# Patient Record
Sex: Female | Born: 1980
Health system: Southern US, Community
[De-identification: ages and names within clinical notes are randomized; demographics above are authoritative.]

## PROBLEM LIST (undated history)

## (undated) DIAGNOSIS — E119 Type 2 diabetes mellitus without complications: Secondary | ICD-10-CM

## (undated) DIAGNOSIS — J45909 Unspecified asthma, uncomplicated: Secondary | ICD-10-CM

## (undated) DIAGNOSIS — N926 Irregular menstruation, unspecified: Secondary | ICD-10-CM

## (undated) DIAGNOSIS — F329 Major depressive disorder, single episode, unspecified: Secondary | ICD-10-CM

## (undated) DIAGNOSIS — N390 Urinary tract infection, site not specified: Secondary | ICD-10-CM

## (undated) DIAGNOSIS — N946 Dysmenorrhea, unspecified: Secondary | ICD-10-CM

## (undated) DIAGNOSIS — M4800 Spinal stenosis, site unspecified: Secondary | ICD-10-CM

## (undated) DIAGNOSIS — R29818 Other symptoms and signs involving the nervous system: Secondary | ICD-10-CM

## (undated) DIAGNOSIS — F419 Anxiety disorder, unspecified: Secondary | ICD-10-CM

## (undated) DIAGNOSIS — M50321 Other cervical disc degeneration at C4-C5 level: Secondary | ICD-10-CM

## (undated) DIAGNOSIS — M719 Bursopathy, unspecified: Secondary | ICD-10-CM

## (undated) DIAGNOSIS — F32A Depression, unspecified: Secondary | ICD-10-CM

## (undated) DIAGNOSIS — N898 Other specified noninflammatory disorders of vagina: Secondary | ICD-10-CM

## (undated) HISTORY — DX: Unspecified asthma, uncomplicated: J45.909

## (undated) HISTORY — DX: Type 2 diabetes mellitus without complications: E11.9

## (undated) HISTORY — DX: Anxiety disorder, unspecified: F41.9

## (undated) HISTORY — PX: ROTATOR CUFF REPAIR: SHX139

## (undated) HISTORY — DX: Other specified noninflammatory disorders of vagina: N89.8

## (undated) HISTORY — DX: Spinal stenosis, site unspecified: M48.00

## (undated) HISTORY — DX: Bursopathy, unspecified: M71.9

## (undated) HISTORY — DX: Urinary tract infection, site not specified: N39.0

## (undated) HISTORY — DX: Irregular menstruation, unspecified: N92.6

## (undated) HISTORY — DX: Dysmenorrhea, unspecified: N94.6

## (undated) HISTORY — DX: Other cervical disc degeneration at C4-C5 level: M50.321

## (undated) HISTORY — DX: Major depressive disorder, single episode, unspecified: F32.9

## (undated) HISTORY — DX: Other symptoms and signs involving the nervous system: R29.818

## (undated) HISTORY — DX: Depression, unspecified: F32.A

---

## 2001-01-04 HISTORY — PX: TUBAL LIGATION: SHX77

## 2005-01-09 ENCOUNTER — Other Ambulatory Visit: Admission: RE | Admit: 2005-01-09 | Discharge: 2005-01-09 | Payer: Self-pay | Admitting: Family Medicine

## 2010-05-21 ENCOUNTER — Emergency Department (HOSPITAL_COMMUNITY): Admission: EM | Admit: 2010-05-21 | Discharge: 2010-05-21 | Payer: Self-pay | Admitting: Emergency Medicine

## 2010-05-23 ENCOUNTER — Emergency Department (HOSPITAL_COMMUNITY): Admission: EM | Admit: 2010-05-23 | Discharge: 2010-05-23 | Payer: Self-pay | Admitting: Emergency Medicine

## 2011-04-09 ENCOUNTER — Other Ambulatory Visit (HOSPITAL_COMMUNITY)
Admission: RE | Admit: 2011-04-09 | Discharge: 2011-04-09 | Disposition: A | Discharge status: Home / Self Care | Payer: Self-pay | Source: Ambulatory Visit | Attending: Obstetrics and Gynecology | Admitting: Obstetrics and Gynecology

## 2011-04-09 DIAGNOSIS — Z01419 Encounter for gynecological examination (general) (routine) without abnormal findings: Secondary | ICD-10-CM | POA: Insufficient documentation

## 2012-03-24 ENCOUNTER — Encounter: Payer: Self-pay | Admitting: Internal Medicine

## 2012-04-15 ENCOUNTER — Encounter: Payer: Self-pay | Admitting: Internal Medicine

## 2012-04-16 ENCOUNTER — Encounter: Payer: Self-pay | Admitting: Internal Medicine

## 2012-04-16 ENCOUNTER — Ambulatory Visit (INDEPENDENT_AMBULATORY_CARE_PROVIDER_SITE_OTHER): Payer: BC Managed Care – PPO | Admitting: Internal Medicine

## 2012-04-16 ENCOUNTER — Other Ambulatory Visit (INDEPENDENT_AMBULATORY_CARE_PROVIDER_SITE_OTHER): Payer: BC Managed Care – PPO

## 2012-04-16 ENCOUNTER — Other Ambulatory Visit: Payer: Self-pay | Admitting: *Deleted

## 2012-04-16 VITALS — BP 100/60 | HR 70 | Ht 61.25 in | Wt 166.4 lb

## 2012-04-16 DIAGNOSIS — R197 Diarrhea, unspecified: Secondary | ICD-10-CM

## 2012-04-16 DIAGNOSIS — F329 Major depressive disorder, single episode, unspecified: Secondary | ICD-10-CM | POA: Insufficient documentation

## 2012-04-16 DIAGNOSIS — K529 Noninfective gastroenteritis and colitis, unspecified: Secondary | ICD-10-CM | POA: Insufficient documentation

## 2012-04-16 DIAGNOSIS — F32A Depression, unspecified: Secondary | ICD-10-CM | POA: Insufficient documentation

## 2012-04-16 DIAGNOSIS — F419 Anxiety disorder, unspecified: Secondary | ICD-10-CM

## 2012-04-16 DIAGNOSIS — E119 Type 2 diabetes mellitus without complications: Secondary | ICD-10-CM | POA: Insufficient documentation

## 2012-04-16 DIAGNOSIS — E1165 Type 2 diabetes mellitus with hyperglycemia: Secondary | ICD-10-CM | POA: Insufficient documentation

## 2012-04-16 DIAGNOSIS — N390 Urinary tract infection, site not specified: Secondary | ICD-10-CM | POA: Insufficient documentation

## 2012-04-16 DIAGNOSIS — J45909 Unspecified asthma, uncomplicated: Secondary | ICD-10-CM | POA: Insufficient documentation

## 2012-04-16 DIAGNOSIS — F411 Generalized anxiety disorder: Secondary | ICD-10-CM

## 2012-04-16 HISTORY — DX: Depression, unspecified: F32.A

## 2012-04-16 HISTORY — DX: Anxiety disorder, unspecified: F41.9

## 2012-04-16 LAB — CBC
RBC: 5.19 Mil/uL — ABNORMAL HIGH (ref 3.87–5.11)
WBC: 12.1 10*3/uL — ABNORMAL HIGH (ref 4.5–10.5)

## 2012-04-16 LAB — COMPREHENSIVE METABOLIC PANEL
Albumin: 4.2 g/dL (ref 3.5–5.2)
CO2: 21 mEq/L (ref 19–32)
GFR: 124.66 mL/min (ref >60.00)
Glucose, Bld: 196 mg/dL — ABNORMAL HIGH (ref 70–99)
Potassium: 3.5 mEq/L (ref 3.5–5.1)
Sodium: 136 mEq/L (ref 135–145)
Total Protein: 7.3 g/dL (ref 6.0–8.3)

## 2012-04-16 LAB — TSH: TSH: 1.01 u[IU]/mL (ref 0.35–5.50)

## 2012-04-16 MED ORDER — DIPHENOXYLATE-ATROPINE 2.5-0.025 MG PO TABS: 1.0000 | ORAL_TABLET | Freq: Four times a day (QID) | ORAL | Status: DC | PRN

## 2012-04-16 MED ORDER — BUSPIRONE HCL 10 MG PO TABS: 10.0000 mg | ORAL_TABLET | Freq: Three times a day (TID) | ORAL | Status: DC

## 2012-04-16 NOTE — Progress Notes (Signed)
Patient ID: Olivia Church, female   DOB: [birth year], 31 y.o.   MRN: [id number]  SUBJECTIVE: HPI Olivia Church is a 31 yo female with PMH of anxiety, asthma, depression, DM (diet controlled) and chronic UTI who is seen for evaluation of chronic diarrhea.  The patient reports years, perhaps as many as 10, of chronic diarrhea. She has loose watery stools 8-10 times daily, which starts in the morning. The morning hours 10 to be worse for her. Her diarrhea is worse after eating, and at times she has to leave the table to have a stool. She finds her diarrhea be confining and also worsens dramatically during periods of stress, such as being in public places.  She denies abdominal pain. No nausea or vomiting. No blood in her stool or melena. She does frequently have borborygmi. She does not know any trigger foods such as dairy. She did try to increase her fiber but this only caused more stools. She has not lost weight. No fevers or chills. She denies nocturnal stools. Her mother recently visited from [place], and given the anxiety the patient has plus feels regarding her diarrhea, her mother let her borrow some of her alprazolam.  The patient has found this is helped dramatically with her diarrhea, and is also slowed her stools. She's taking 0.5 mg in the morning. In the past she has tried Imodium which she said does decrease the frequency of her stools, but overall does not help her. She also felt it lost efficacy over time. She is also tried acidophilus as a probiotic and Kaopectate without benefit.  She has never submitted stool studies and has never had any endoscopic procedures.  No family history of colon cancer, IBS or other GI illnesses she is aware  Review of Systems  As per history of present illness, otherwise negative   Past Medical History  Diagnosis Date  . Anxiety   . Asthma   . Depression   . Diabetes mellitus     diet controlled  . Chronic UTI (urinary tract infection)     Current  Outpatient Prescriptions  Medication Sig Dispense Refill  . albuterol (PROVENTIL HFA;VENTOLIN HFA) 108 (90 BASE) MCG/ACT inhaler Inhale 2 puffs into the lungs every 6 (six) hours as needed.      . busPIRone (BUSPAR) 10 MG tablet Take 1 tablet (10 mg total) by mouth 3 (three) times daily.  30 tablet  1  . diphenoxylate-atropine (LOMOTIL) 2.5-0.025 MG per tablet Take 1 tablet by mouth 4 (four) times daily as needed for diarrhea or loose stools.  30 tablet  0    No Known Allergies  Family History  Problem Relation Age of Onset  . Diabetes Mother   . Ovarian cancer Mother   . Colon cancer Neg Hx     History  Substance Use Topics  . Smoking status: Current Every Day Smoker -- 1.0 packs/day for 15 years    Types: Cigarettes  . Smokeless tobacco: Never Used   Comment: tobacco info given 04/16/12  . Alcohol Use: Yes     wine    OBJECTIVE: BP 100/60  Pulse 70  Ht 5' 1.25" (1.556 m)  Wt 166 lb 6.4 oz (75.479 kg)  BMI 31.19 kg/m2  LMP 04/11/2012 Constitutional: Well-developed and well-nourished. No distress. HEENT: Normocephalic and atraumatic. Oropharynx is clear and moist. No oropharyngeal exudate. Conjunctivae are normal. Pupils are equal round and reactive to light. No scleral icterus. Neck: Neck supple. Trachea midline. Cardiovascular: Normal rate, regular rhythm and  intact distal pulses. No M/R/G Pulmonary/chest: Effort normal and breath sounds normal. No wheezing, rales or rhonchi. Abdominal: Soft, nontender, nondistended. Bowel sounds active throughout. There are no masses palpable. No hepatosplenomegaly. Extremities: no clubbing, cyanosis, or edema Lymphadenopathy: No cervical adenopathy noted. Neurological: Alert and oriented to person place and time. Skin: Skin is warm and dry. No rashes noted. Psychiatric: Normal mood and affect. Behavior is normal.  Labs  pending  ASSESSMENT AND PLAN: 31 yo female with PMH of anxiety, asthma, depression, DM (diet controlled) and  chronic UTI who is seen for evaluation of chronic diarrhea.   1.  Chronic diarrhea -- we discussed her chronic diarrhea at length today and we'll start with the basics. I'll order stool studies to include C. difficile, ova and parasite, fecal leukocytes, culture, and fecal elastase.  Also check a TSH, celiac panel, complete blood count, and metabolic panel.  I will give her prescription for Lomotil to be used 4 times a day when necessary for diarrhea. I do think her anxiety is linked to her symptoms, and we have discussed my reluctance to use chronic or long-term benzodiazepines for anxiety. We have discussed their habit-forming nature, and the likelihood of needing to escalate doses over time.  I first would like to make sure that there is no infectious or inflammatory condition, and if not the diagnosis is likely IBS with diarrhea predominance. I will give her prescription for buspirone, see #2.  We also discussed using alosetron in the future if IBS-D is confirmed. Assuming lab results and stool studies are unremarkable, we will proceed with colonoscopy to rule out inflammatory condition such as microscopic colitis.  2.  Anxiety -- see #1.  I do not think chronic benzodiazepine is the best option or her. She's tried Lexapro and Effexor in the past for depression, and did not find this helped with anxiety.  I will start her on buspirone 5 mg twice a day. She will plan to increase the dose to 10 mg twice a day after 3 days. If her anxiety is improved but not completely better, she can then dose escalate again after 3 days to 20 mg twice a day. She will not use further Xanax for now.  Further recommendations after labs and procedure

## 2012-04-16 NOTE — Patient Instructions (Addendum)
You have been scheduled for a colonoscopy with propofol. Please follow written instructions given to you at your visit today.  Please pick up your prep kit at the pharmacy within the next 1-3 days. If you use inhalers (even only as needed), please bring them with you on the day of your procedure.  Your physician has requested that you go to the basement for the following lab work before leaving today: Stool Studies, CBC, CMP TSH Celiac panel  We have sent the following medications to your pharmacy for you to pick up at your convenience: Moviprep, Lomotil, Buspurone take as directed  Avoid taking Horatio Pel

## 2012-04-17 ENCOUNTER — Telehealth: Payer: Self-pay | Admitting: Internal Medicine

## 2012-04-17 DIAGNOSIS — K529 Noninfective gastroenteritis and colitis, unspecified: Secondary | ICD-10-CM

## 2012-04-17 MED ORDER — BUSPIRONE HCL 10 MG PO TABS: ORAL_TABLET | ORAL | Status: DC

## 2012-04-17 NOTE — Telephone Encounter (Signed)
Checked with the lab and the stool samples need to be turned in within 24 hours and must be kept refrigerated. Pt stated understanding. She reports her buspar has not been filled d/t a question on the order. Called and gave a verbal to [facility] and also faxed a corrected order.

## 2012-04-18 ENCOUNTER — Telehealth: Payer: Self-pay | Admitting: *Deleted

## 2012-04-18 NOTE — Telephone Encounter (Signed)
Pt walked into office today requesting her lab results; she had just dropped off her stool samples. I gave her a copy of the serum results, but informed her Dr Rhea Belton HAS NOT reviewed them so do not think something is wrong until Dr Rhea Belton has resulted them. Her WBC was high and I explained this could be a sign of infection, but it may not be significant when everything is reviewed; pt stated understanding.

## 2012-04-19 ENCOUNTER — Other Ambulatory Visit: Payer: Self-pay | Admitting: Internal Medicine

## 2012-04-21 LAB — OVA AND PARASITE SCREEN

## 2012-04-22 ENCOUNTER — Telehealth: Payer: Self-pay | Admitting: Internal Medicine

## 2012-04-22 ENCOUNTER — Other Ambulatory Visit: Payer: Self-pay | Admitting: Gastroenterology

## 2012-04-22 LAB — STOOL CULTURE

## 2012-04-22 NOTE — Telephone Encounter (Signed)
Left a message for patient to call me. 

## 2012-04-22 NOTE — Telephone Encounter (Signed)
Patient is asking for results. Looks like part of stool studies are back. May I give her the results?

## 2012-04-22 NOTE — Telephone Encounter (Signed)
Spoke with patient and gave her results and recommendations as per Dr. Rhea Belton.

## 2012-04-22 NOTE — Telephone Encounter (Signed)
Yes, stool studies neg today, but waiting on elastase. Labs unremarkable, WBC is a little high which is nonspecific. Hopefully buspirone is helping with anxiety. Colonoscopy should be pending

## 2012-04-25 ENCOUNTER — Other Ambulatory Visit: Payer: Self-pay | Admitting: Gastroenterology

## 2012-04-25 ENCOUNTER — Other Ambulatory Visit: Payer: Self-pay | Admitting: Internal Medicine

## 2012-04-28 ENCOUNTER — Other Ambulatory Visit: Payer: Self-pay | Admitting: Gastroenterology

## 2012-04-28 ENCOUNTER — Other Ambulatory Visit: Payer: Self-pay | Admitting: Internal Medicine

## 2012-05-05 ENCOUNTER — Telehealth: Payer: Self-pay | Admitting: Internal Medicine

## 2012-05-05 MED ORDER — PEG-KCL-NACL-NASULF-NA ASC-C 100 G PO SOLR: 1.0000 | Freq: Once | ORAL | Status: DC

## 2012-05-05 NOTE — Telephone Encounter (Signed)
Sent in prep for procedure to pt's pharmacy

## 2012-05-08 ENCOUNTER — Encounter: Payer: Self-pay | Admitting: Internal Medicine

## 2012-05-08 ENCOUNTER — Ambulatory Visit (AMBULATORY_SURGERY_CENTER): Payer: BC Managed Care – PPO | Admitting: Internal Medicine

## 2012-05-08 VITALS — BP 113/79 | HR 86 | Temp 98.3°F | Resp 16 | Ht 61.0 in | Wt 166.0 lb

## 2012-05-08 DIAGNOSIS — D126 Benign neoplasm of colon, unspecified: Secondary | ICD-10-CM

## 2012-05-08 DIAGNOSIS — R197 Diarrhea, unspecified: Secondary | ICD-10-CM

## 2012-05-08 DIAGNOSIS — K635 Polyp of colon: Secondary | ICD-10-CM

## 2012-05-08 MED ORDER — SODIUM CHLORIDE 0.9 % IV SOLN: 500.0000 mL | INTRAVENOUS | Status: DC

## 2012-05-08 NOTE — Progress Notes (Signed)
Patient did not experience any of the following events: a burn prior to discharge; a fall within the facility; wrong site/side/patient/procedure/implant event; or a hospital transfer or hospital admission upon discharge from the facility. (G8907) Patient did not have preoperative order for IV antibiotic SSI prophylaxis. (G8918)  

## 2012-05-08 NOTE — Op Note (Signed)
[facility] [address] [place], [address]   COLONOSCOPY PROCEDURE REPORT  PATIENT: Olivia Church, Olivia Church  MR#: [id number] BIRTHDATE: 03-Feb-1981 , 31  yrs. old GENDER: Female ENDOSCOPIST: Beverley Fiedler, MD REFERRED XB:[id number], Jodelle Red. PROCEDURE DATE:  05/08/2012 PROCEDURE:   Colonoscopy with biopsy and Colonoscopy with cold biopsy polypectomy ASA CLASS:   Class II INDICATIONS:chronic diarrhea and unexplained diarrhea. MEDICATIONS: MAC sedation, administered by CRNA and propofol (Diprivan) 300mg  IV  DESCRIPTION OF PROCEDURE:   After the risks benefits and alternatives of the procedure were thoroughly explained, informed consent was obtained.  A digital rectal exam revealed no abnormalities of the rectum.   The LB [id number]-[id number]  endoscope was introduced through the anus and advanced to the terminal ileum which was intubated for a short distance. No adverse events experienced.   The quality of the prep was good, using MoviPrep The instrument was then slowly withdrawn as the colon was fully examined.  COLON FINDINGS: The mucosa appeared normal in the terminal ileum. The colonic mucosa appeared normal throughout the entire examined colon.  Multiple random biopsies from right and left colon were performed.   Two diminutive sessile polyps, measuring 2-3 mm in size, were found in the rectosigmoid colon.  Polypectomy was performed with cold forceps.  All resections were complete and all polyp tissue was completely retrieved.  Retroflexed views revealed no abnormalities. The time to cecum=1 minutes 11 seconds. Withdrawal time=8 minutes 34 seconds.  The scope was withdrawn and the procedure completed. COMPLICATIONS: There were no complications.  ENDOSCOPIC IMPRESSION: 1.   Normal mucosa in the terminal ileum 2.   The colonic mucosa appeared normal throughout the entire examined colon; multiple random biopsies of the area were performed  3.   Two diminutive sessile  polyps, measuring 2-3 mm in size, were found in the rectosigmoid colon; Polypectomy was performed with cold forceps  RECOMMENDATIONS: 1.  Await pathology results 2.  Continue current medications 3.  You will receive a letter within 1-2 weeks with the results of your biopsy as well as final recommendations.  Please call my office if you have not received a letter after 3 weeks.  eSigned:  Beverley Fiedler, MD 05/08/2012 2:09 PM   cc: Bennie Pierini, NP and The Patient

## 2012-05-08 NOTE — Patient Instructions (Addendum)
May increase your Buspar to 30 mg. Twice daily.    YOU HAD AN ENDOSCOPIC PROCEDURE TODAY AT THE [facility]: Refer to the procedure report that was given to you for any specific questions about what was found during the examination.  If the procedure report does not answer your questions, please call your gastroenterologist to clarify.  If you requested that your care partner not be given the details of your procedure findings, then the procedure report has been included in a sealed envelope for you to review at your convenience later.  YOU SHOULD EXPECT: Some feelings of bloating in the abdomen. Passage of more gas than usual.  Walking can help get rid of the air that was put into your GI tract during the procedure and reduce the bloating. If you had a lower endoscopy (such as a colonoscopy or flexible sigmoidoscopy) you may notice spotting of blood in your stool or on the toilet paper. If you underwent a bowel prep for your procedure, then you may not have a normal bowel movement for a few days.  DIET: Your first meal following the procedure should be a light meal and then it is ok to progress to your normal diet.  A half-sandwich or bowl of soup is an example of a good first meal.  Heavy or fried foods are harder to digest and may make you feel nauseous or bloated.  Likewise meals heavy in dairy and vegetables can cause extra gas to form and this can also increase the bloating.  Drink plenty of fluids but you should avoid alcoholic beverages for 24 hours.  ACTIVITY: Your care partner should take you home directly after the procedure.  You should plan to take it easy, moving slowly for the rest of the day.  You can resume normal activity the day after the procedure however you should NOT DRIVE or use heavy machinery for 24 hours (because of the sedation medicines used during the test).    SYMPTOMS TO REPORT IMMEDIATELY: A gastroenterologist can be reached at any hour.  During normal  business hours, 8:30 AM to 5:00 PM Monday through Friday, call [phone].  After hours and on weekends, please call the GI answering service at [phone] who will take a message and have the physician on call contact you.   Following lower endoscopy (colonoscopy or flexible sigmoidoscopy):  Excessive amounts of blood in the stool  Significant tenderness or worsening of abdominal pains  Swelling of the abdomen that is new, acute  Fever of 100F or higher  FOLLOW UP: If any biopsies were taken you will be contacted by phone or by letter within the next 1-3 weeks.  Call your gastroenterologist if you have not heard about the biopsies in 3 weeks.  Our staff will call the home number listed on your records the next business day following your procedure to check on you and address any questions or concerns that you may have at that time regarding the information given to you following your procedure. This is a courtesy call and so if there is no answer at the home number and we have not heard from you through the emergency physician on call, we will assume that you have returned to your regular daily activities without incident.  SIGNATURES/CONFIDENTIALITY: You and/or your care partner have signed paperwork which will be entered into your electronic medical record.  These signatures attest to the fact that that the information above on your After Visit Summary has been  reviewed and is understood.  Full responsibility of the confidentiality of this discharge information lies with you and/or your care-partner.

## 2012-05-09 ENCOUNTER — Other Ambulatory Visit: Payer: Self-pay | Admitting: Internal Medicine

## 2012-05-09 ENCOUNTER — Telehealth: Payer: Self-pay

## 2012-05-09 MED ORDER — DIPHENOXYLATE-ATROPINE 2.5-0.025 MG PO TABS: 1.0000 | ORAL_TABLET | Freq: Four times a day (QID) | ORAL | Status: DC | PRN

## 2012-05-09 NOTE — Telephone Encounter (Signed)
Left message on answering machine. 

## 2012-05-09 NOTE — Telephone Encounter (Signed)
Refill of Lomotil sent to pt's pharmacy

## 2012-05-14 ENCOUNTER — Encounter: Payer: Self-pay | Admitting: Internal Medicine

## 2012-05-16 ENCOUNTER — Other Ambulatory Visit: Payer: Self-pay | Admitting: Gastroenterology

## 2012-05-16 ENCOUNTER — Telehealth: Payer: Self-pay | Admitting: *Deleted

## 2012-05-16 NOTE — Telephone Encounter (Signed)
Called pt to schedule her appt and she reports she is still having diarrhea. The Buspar is helping her anxiety. She reports she called last week for a refill or something for the diarrhea and never heard anything. Informed pt the script was ordered, but it was never received per pharmacy. Called in verbal and pt scheduled for 06/06/12 appt.

## 2012-05-16 NOTE — Telephone Encounter (Signed)
Comments: repeat colonoscopy, age 31 for screening 05/15/12 SENT [facility] follow-up in 1-2 months lmom for pt to call back.

## 2012-05-20 ENCOUNTER — Telehealth: Payer: Self-pay | Admitting: Internal Medicine

## 2012-05-21 ENCOUNTER — Other Ambulatory Visit: Payer: Self-pay | Admitting: Gastroenterology

## 2012-05-21 DIAGNOSIS — K529 Noninfective gastroenteritis and colitis, unspecified: Secondary | ICD-10-CM

## 2012-05-21 MED ORDER — BUSPIRONE HCL 10 MG PO TABS: 20.0000 mg | ORAL_TABLET | Freq: Three times a day (TID) | ORAL | Status: DC

## 2012-05-21 NOTE — Telephone Encounter (Signed)
Spoke to pt. Told her per Dr. Rhea Belton she is to take Buspar 20 mg (2 tablets) TID. Pt stated understanding. sent in new Rx to her pharmacy

## 2012-06-05 ENCOUNTER — Encounter: Payer: Self-pay | Admitting: Internal Medicine

## 2012-06-06 ENCOUNTER — Ambulatory Visit: Payer: BC Managed Care – PPO | Admitting: Internal Medicine

## 2013-04-29 ENCOUNTER — Other Ambulatory Visit: Payer: Self-pay | Admitting: Internal Medicine

## 2013-04-30 NOTE — Telephone Encounter (Signed)
lvm for pt to call me back. Sent in refill

## 2015-01-10 ENCOUNTER — Other Ambulatory Visit (HOSPITAL_COMMUNITY): Payer: Self-pay | Admitting: Family Medicine

## 2015-01-10 DIAGNOSIS — N63 Unspecified lump in unspecified breast: Secondary | ICD-10-CM

## 2015-01-24 ENCOUNTER — Telehealth: Payer: Self-pay | Admitting: Obstetrics and Gynecology

## 2015-01-24 ENCOUNTER — Ambulatory Visit (INDEPENDENT_AMBULATORY_CARE_PROVIDER_SITE_OTHER): Payer: BLUE CROSS/BLUE SHIELD | Admitting: Obstetrics and Gynecology

## 2015-01-24 ENCOUNTER — Encounter: Payer: Self-pay | Admitting: Obstetrics and Gynecology

## 2015-01-24 VITALS — BP 100/56 | Ht 63.0 in | Wt 159.0 lb

## 2015-01-24 DIAGNOSIS — L739 Follicular disorder, unspecified: Secondary | ICD-10-CM | POA: Diagnosis not present

## 2015-01-24 MED ORDER — HYDROCODONE-ACETAMINOPHEN 5-325 MG PO TABS: 20.0000 | ORAL_TABLET | Freq: Four times a day (QID) | ORAL | Status: DC | PRN

## 2015-01-24 MED ORDER — HYDROCODONE-ACETAMINOPHEN 5-325 MG PO TABS: 1.0000 | ORAL_TABLET | Freq: Four times a day (QID) | ORAL | Status: DC | PRN

## 2015-01-24 MED ORDER — CEPHALEXIN 500 MG PO CAPS: 500.0000 mg | ORAL_CAPSULE | Freq: Four times a day (QID) | ORAL | Status: DC

## 2015-01-24 NOTE — Progress Notes (Signed)
Patient ID: Olivia Church, female   DOB: 05-10-1981, 34 y.o.   MRN: [id number] Pt here today for a boil that she has had for about 4 days. Pt states that it is very painful and hurts to walk.

## 2015-01-24 NOTE — Telephone Encounter (Signed)
Note to be out of work x 24 hours written , with Return to work Wednesday

## 2015-01-24 NOTE — Progress Notes (Signed)
Patient ID: Olivia Church, female   DOB: 1980-11-04, 34 y.o.   MRN: [id number]    [facility] Visit  Patient name: Olivia Church MRN [id number]  Date of birth: [birth year]  CC & HPI:  Olivia Church is a 34 y.o. female with a history of DM and s/p tubal ligation presenting today for an area of gradually worsening swelling and redness that started 4 days ago. She notes mild drainage from the area as an associated symptom. Pt states pain becomes worse with walking. She tried Motrin, shaving and applying baby powder with no relief. Pt has also tried warm compresses with some relief.   Pt also complains of irregular menses in the last several months. She does not know the date of her last Pap.   ROS:  A complete 10 system review of systems was obtained and all systems are negative except as noted in the HPI and PMH.   Pertinent History Reviewed:   Reviewed: Significant for DM and tubal ligation Medical         Past Medical History  Diagnosis Date  . Anxiety   . Asthma   . Depression   . Diabetes mellitus     diet controlled  . Chronic UTI (urinary tract infection)                               Surgical Hx:    Past Surgical History  Procedure Laterality Date  . Tubal ligation  01-04-01   Medications: Reviewed & Updated - see associated section                       Current outpatient prescriptions:  .  albuterol (PROVENTIL HFA;VENTOLIN HFA) 108 (90 BASE) MCG/ACT inhaler, Inhale 2 puffs into the lungs every 6 (six) hours as needed., Disp: , Rfl:  .  empagliflozin (JARDIANCE) 10 MG TABS tablet, Take 10 mg by mouth daily., Disp: , Rfl:  .  sertraline (ZOLOFT) 100 MG tablet, Take 100 mg by mouth daily., Disp: , Rfl:    Social History: Reviewed -  reports that she has quit smoking. Her smoking use included Cigarettes. She has a 15 pack-year smoking history. She has never used smokeless tobacco.  Objective Findings:  Vitals: Blood pressure 100/56, height 5\' 3"  (1.6 m),  weight 159 lb (72.122 kg).  Physical Examination: General appearance - alert, well appearing, and in no distress and oriented to person, place, and time Mental status - alert, oriented to person, place, and time, normal mood, behavior, speech, dress, motor activity, and thought processes Pelvic - VULVA: 2.5 x 1 cm ulcerative lesion on the right labia minora outer aspect; no groin adenopathy, no fluctuance no vesicles. Pt medically knowledgable.  Assessment & Plan:   A: folliculitis right labia minora.   1. Ulcerative lesion on the right labia minora, do not suspect herpes due to singular lesion  P:  1. Pt to call in 72 hours with no improvement  Will tx for MRSA 2. Prescribe Keflex, 15 Hydrocodone 5/325 3. Pt to continue warm compresses, Epsom salt soaks and apply Neosporin as needed 4. Pt to return for pap smear and follow-up of irregular menses    This chart was scribed for [name], MD by [name], ED Scribe. This patient was seen in room 2 and the patient's care was started at 10:13 AM.   I personally performed the services described in  this documentation, which was SCRIBED in my presence. The recorded information has been reviewed and considered accurate. It has been edited as necessary during review. [name], MD

## 2015-01-25 ENCOUNTER — Encounter (HOSPITAL_COMMUNITY): Payer: Self-pay

## 2015-02-08 ENCOUNTER — Encounter (HOSPITAL_COMMUNITY): Payer: Self-pay

## 2015-02-09 ENCOUNTER — Encounter: Payer: Self-pay | Admitting: Obstetrics and Gynecology

## 2015-02-09 ENCOUNTER — Ambulatory Visit: Payer: BLUE CROSS/BLUE SHIELD | Admitting: Obstetrics and Gynecology

## 2015-02-15 ENCOUNTER — Encounter (HOSPITAL_COMMUNITY): Payer: Self-pay

## 2015-03-04 ENCOUNTER — Other Ambulatory Visit (HOSPITAL_COMMUNITY)
Admission: RE | Admit: 2015-03-04 | Discharge: 2015-03-04 | Disposition: A | Discharge status: Home / Self Care | Payer: BLUE CROSS/BLUE SHIELD | Source: Ambulatory Visit | Attending: Adult Health | Admitting: Adult Health

## 2015-03-04 ENCOUNTER — Ambulatory Visit (INDEPENDENT_AMBULATORY_CARE_PROVIDER_SITE_OTHER): Payer: BLUE CROSS/BLUE SHIELD | Admitting: Adult Health

## 2015-03-04 ENCOUNTER — Encounter: Payer: Self-pay | Admitting: Adult Health

## 2015-03-04 VITALS — BP 102/64 | HR 84 | Ht 61.5 in | Wt 154.5 lb

## 2015-03-04 DIAGNOSIS — Z01419 Encounter for gynecological examination (general) (routine) without abnormal findings: Secondary | ICD-10-CM | POA: Diagnosis not present

## 2015-03-04 DIAGNOSIS — N926 Irregular menstruation, unspecified: Secondary | ICD-10-CM | POA: Insufficient documentation

## 2015-03-04 DIAGNOSIS — Z1151 Encounter for screening for human papillomavirus (HPV): Secondary | ICD-10-CM | POA: Diagnosis present

## 2015-03-04 DIAGNOSIS — N946 Dysmenorrhea, unspecified: Secondary | ICD-10-CM

## 2015-03-04 DIAGNOSIS — N898 Other specified noninflammatory disorders of vagina: Secondary | ICD-10-CM

## 2015-03-04 HISTORY — DX: Other specified noninflammatory disorders of vagina: N89.8

## 2015-03-04 HISTORY — DX: Dysmenorrhea, unspecified: N94.6

## 2015-03-04 HISTORY — DX: Irregular menstruation, unspecified: N92.6

## 2015-03-04 LAB — POCT WET PREP (WET MOUNT)

## 2015-03-04 NOTE — Patient Instructions (Signed)
Endometrial Ablation Endometrial ablation removes the lining of the uterus (endometrium). It is usually a same-day, outpatient treatment. Ablation helps avoid major surgery, such as surgery to remove the cervix and uterus (hysterectomy). After endometrial ablation, you will have little or no menstrual bleeding and may not be able to have children. However, if you are premenopausal, you will need to use a reliable method of birth control following the procedure because of the small chance that pregnancy can occur. There are different reasons to have this procedure, which include:  Heavy periods.  Bleeding that is causing anemia.  Irregular bleeding.  Bleeding fibroids on the lining inside the uterus if they are smaller than 3 centimeters. This procedure should not be done if:  You want children in the future.  You have severe cramps with your menstrual period.  You have precancerous or cancerous cells in your uterus.  You were recently pregnant.  You have gone through menopause.  You have had major surgery on the uterus, such as a cesarean delivery. LET [facility] CARE PROVIDER KNOW ABOUT:  Any allergies you have.  All medicines you are taking, including vitamins, herbs, eye drops, creams, and over-the-counter medicines.  Previous problems you or members of your family have had with the use of anesthetics.  Any blood disorders you have.  Previous surgeries you have had.  Medical conditions you have. RISKS AND COMPLICATIONS  Generally, this is a safe procedure. However, as with any procedure, complications can occur. Possible complications include:  Perforation of the uterus.  Bleeding.  Infection of the uterus, bladder, or vagina.  Injury to surrounding organs.  An air bubble to the lung (air embolus).  Pregnancy following the procedure.  Failure of the procedure to help the problem, requiring hysterectomy.  Decreased ability to diagnose cancer in the lining of  the uterus. BEFORE THE PROCEDURE  The lining of the uterus must be tested to make sure there is no pre-cancerous or cancer cells present.  An ultrasound may be performed to look at the size of the uterus and to check for abnormalities.  Medicines may be given to thin the lining of the uterus. PROCEDURE  During the procedure, your health care provider will use a tool called a resectoscope to help see inside your uterus. There are different ways to remove the lining of your uterus.   Radiofrequency - This method uses a radiofrequency-alternating electric current to remove the lining of the uterus.  Cryotherapy - This method uses extreme cold to freeze the lining of the uterus.  Heated-Free Liquid - This method uses heated salt (saline) solution to remove the lining of the uterus.  Microwave - This method uses high-energy microwaves to heat up the lining of the uterus to remove it.  Thermal balloon - This method involves inserting a catheter with a balloon tip into the uterus. The balloon tip is filled with heated fluid to remove the lining of the uterus. AFTER THE PROCEDURE  After your procedure, do not have sexual intercourse or insert anything into your vagina until permitted by your health care provider. After the procedure, you may experience:  Cramps.  Vaginal discharge.  Frequent urination. Document Released: 05/11/2004 Document Revised: 03/04/2013 Document Reviewed: 12/03/2012 [facility] Patient Information 2015 [place], [place]. This information is not intended to replace advice given to you by your health care provider. Make sure you discuss any questions you have with your health care provider. Look up ADDYI Physical in 1 year Mammogram at 57 Return in 1  week to talk with Dr Glo Herring

## 2015-03-04 NOTE — Progress Notes (Signed)
Patient ID: Olivia Church, female   DOB: 08/05/1980, 34 y.o.   MRN: [id number] History of Present Illness:  Olivia Church is a 34 year old white female, married in for well woman gyn exam and pap,she complains of vaginal discharge, no odor or itching and decreased sex drive,would rather watch TV.Marland KitchenHas irregular periods and cramps.She said PCP was going to get mammogram for lumpy breasts, but she can't seem to get there.She is working third shift. PCP [facility].  Current Medications, Allergies, Past Medical History, Past Surgical History, Family History and Social History were reviewed in [employer] record.     Review of Systems: Patient denies any headaches, hearing loss, fatigue, blurred vision, shortness of breath, chest pain, abdominal pain, problems with bowel movements, urination, or intercourse. No joint pain or mood swings.See HPI for positives.    Physical Exam:BP 102/64 mmHg  Pulse 84  Ht 5' 1.5" (1.562 m)  Wt 154 lb 8 oz (70.081 kg)  BMI 28.72 kg/m2  LMP 01/30/2015 General:  Well developed, well nourished, no acute distress Skin:  Warm and dry Neck:  Midline trachea, normal thyroid, good ROM, no lymphadenopathy Lungs; Clear to auscultation bilaterally Breast:  No dominant palpable mass, retraction, or nipple discharge,has regular irregularities. Cardiovascular: Regular rate and rhythm Abdomen:  Soft, non tender, no hepatosplenomegaly Pelvic:  External genitalia is normal in appearance, no lesions.  The vagina is normal in appearance, scant white discharge. Urethra has no lesions or masses. The cervix is bulbous.Pap with HPV performed.  Uterus is felt to be normal size, shape, and contour.  No adnexal masses or tenderness noted.Bladder is non tender, no masses felt.wet prep shows few WBC. Extremities/musculoskeletal:  No swelling or varicosities noted, no clubbing or cyanosis Psych:  No mood changes, alert and cooperative,seems happy Discussed  increasing foreplay and also ablation.She says hysterectomy would be fine.She is aware discharge looks normal and no infection.  Impression: Well woman gyn exam with pap Irregular periods Dysmenorrhea  Vaginal discharge     Plan: Return in 1 week to discuss ablation google [facility]DYI Physical in 1 year Mammogram at 61

## 2015-03-07 LAB — CYTOLOGY - PAP

## 2015-03-11 ENCOUNTER — Encounter: Payer: BLUE CROSS/BLUE SHIELD | Admitting: Obstetrics and Gynecology

## 2015-03-14 ENCOUNTER — Encounter: Payer: Self-pay | Admitting: Obstetrics and Gynecology

## 2015-03-14 ENCOUNTER — Ambulatory Visit (INDEPENDENT_AMBULATORY_CARE_PROVIDER_SITE_OTHER): Payer: BLUE CROSS/BLUE SHIELD | Admitting: Obstetrics and Gynecology

## 2015-03-14 VITALS — BP 120/78 | Ht 61.0 in | Wt 159.0 lb

## 2015-03-14 DIAGNOSIS — N921 Excessive and frequent menstruation with irregular cycle: Secondary | ICD-10-CM | POA: Diagnosis not present

## 2015-03-14 NOTE — Progress Notes (Signed)
Patient ID: Olivia Church, female   DOB: 1981-05-07, 34 y.o.   MRN: [id number] Initial evaluation for heavy periods.  Pt comes in referral from Smitty Cords at annual visit. Pt describes self as "done with " the irregular and heavy period. She will return for exam and Preoperative History and Physical.  Olivia Church is a 34 y.o. here for discussion of surgical management of menses by possible endometrial ablation. No significant preoperative concerns. Pt reports her last ultrasound was completed 2 years ago at the practice. Pt reports sporadic menstrual periods, noting that it often abruptly starts and stops multiple times in one cycle. Pt states historically she had normal menstrual periods. Pt reports her PCP completed labs recently and everything was normal, including her thyroids.   Proposed surgery: Endometrial Ablation   Past Medical History  Diagnosis Date  . Anxiety   . Asthma   . Depression   . Diabetes mellitus     diet controlled  . Chronic UTI (urinary tract infection)   . Bursitis   . Irregular periods 03/04/2015  . Dysmenorrhea 03/04/2015  . Vaginal discharge 03/04/2015   Past Surgical History  Procedure Laterality Date  . Tubal ligation  01-04-01   OB History  Gravida Para Term Preterm AB SAB TAB Ectopic Multiple Living  4 3   1  1   3     # Outcome Date GA Lbr Len/2nd Weight Sex Delivery Anes PTL Lv  4 TAB           3 Para           2 Para           1 Para             Patient denies any other pertinent gynecologic issues.   Current Outpatient Prescriptions on File Prior to Visit  Medication Sig Dispense Refill  . albuterol (PROVENTIL HFA;VENTOLIN HFA) 108 (90 BASE) MCG/ACT inhaler Inhale 2 puffs into the lungs every 6 (six) hours as needed.    . empagliflozin (JARDIANCE) 10 MG TABS tablet Take 10 mg by mouth daily.    . Ginkgo Biloba (GINKOBA PO) Take by mouth daily.    . Multiple Vitamin (MULTIVITAMIN) tablet Take 1 tablet by mouth daily.    . phentermine 37.5  MG capsule Take 37.5 mg by mouth daily.    . sertraline (ZOLOFT) 100 MG tablet Take 100 mg by mouth daily.     No current facility-administered medications on file prior to visit.   No Known Allergies  Social History:   reports that she has quit smoking. Her smoking use included Cigarettes. She has a 15 pack-year smoking history. She has never used smokeless tobacco. She reports that she drinks alcohol. She reports that she does not use illicit drugs.  Family History  Problem Relation Age of Onset  . Diabetes Mother   . Ovarian cancer Mother   . Colon cancer Neg Hx     Review of Systems: Noncontributory  PHYSICAL EXAM: Blood pressure 120/78, height 5\' 1"  (1.549 m), weight 159 lb (72.122 kg), last menstrual period 03/12/2015. Office visit only for consult on endometrial ablation.   Labs: Results for orders placed or performed in visit on 03/04/15 (from the past 336 hour(s))  Cytology - PAP   Collection Time: 03/04/15 12:00 AM  Result Value Ref Range   CYTOLOGY - PAP PAP RESULT   POCT Wet Prep [facility] [place])   Collection Time: 03/04/15 12:09 PM  Result Value Ref Range  Source Wet Prep POC     WBC, Wet Prep HPF POC few    Bacteria Wet Prep HPF POC  None, Few   BACTERIA WET PREP MORPHOLOGY POC     Clue Cells Wet Prep HPF POC None None   Clue Cells Wet Prep Whiff POC     Yeast Wet Prep HPF POC None    KOH Wet Prep POC     Trichomonas Wet Prep HPF POC none     Imaging Studies: No results found.  Assessment: Patient Active Problem List   Diagnosis Date Noted  . Irregular periods 03/04/2015  . Dysmenorrhea 03/04/2015  . Vaginal discharge 03/04/2015  . Acute folliculitis vulva 49/44/9675  . Chronic diarrhea 04/16/2012  . Anxiety 04/16/2012  . Depression 04/16/2012  . Asthma 04/16/2012  . DM (diabetes mellitus) 04/16/2012  . Chronic UTI 04/16/2012   Assessment:  1. Irregular and prolonged periods  Plan: 1. Patient will need evaluation by u/s and exam, she wished to  be considered for surgical management with endometrial ablation.  2. Schedule ultrasound exam on 03/25/15, after current menstrual period is over.  3. Start provera  on last day of period, 03/17/15.  4 pt request prompt eval and surgery within 2 months if deemed appropriate.  .mec 03/14/2015 12:24 PM   This chart was SCRIBED for Mallory Shirk, MD by Terressa Koyanagi, ED Scribe. This patient was seen in office, and the patient's care was started at 12:24 PM.  I personally performed the services described in this documentation, which was SCRIBED in my presence. The recorded information has been reviewed and considered accurate. It has been edited as necessary during review. [name], MD

## 2015-03-14 NOTE — Progress Notes (Signed)
Patient ID: Olivia Church, female   DOB: 12-27-1980, 34 y.o.   MRN: [id number] Pt here today for pre op and to discuss ablation. Pt states that she started her period 2 days ago and it is still pretty heavy. Pt would like to schedule everything and come back for exam.

## 2015-03-18 ENCOUNTER — Other Ambulatory Visit: Payer: Self-pay | Admitting: Obstetrics and Gynecology

## 2015-03-18 ENCOUNTER — Telehealth: Payer: Self-pay | Admitting: *Deleted

## 2015-03-18 DIAGNOSIS — N939 Abnormal uterine and vaginal bleeding, unspecified: Secondary | ICD-10-CM

## 2015-03-18 NOTE — Telephone Encounter (Signed)
Pt states Provera Rx not at her pharmacy.

## 2015-03-20 ENCOUNTER — Other Ambulatory Visit: Payer: Self-pay | Admitting: Obstetrics and Gynecology

## 2015-03-20 MED ORDER — MEDROXYPROGESTERONE ACETATE 5 MG PO TABS: 5.0000 mg | ORAL_TABLET | Freq: Every day | ORAL | Status: DC

## 2015-03-20 NOTE — Telephone Encounter (Signed)
escribed provera 5 daily x 39 day as preop

## 2015-03-25 ENCOUNTER — Ambulatory Visit: Payer: BLUE CROSS/BLUE SHIELD | Admitting: Obstetrics and Gynecology

## 2015-03-25 ENCOUNTER — Other Ambulatory Visit: Payer: BLUE CROSS/BLUE SHIELD

## 2015-03-29 ENCOUNTER — Other Ambulatory Visit: Payer: BLUE CROSS/BLUE SHIELD

## 2015-03-29 ENCOUNTER — Ambulatory Visit: Payer: BLUE CROSS/BLUE SHIELD | Admitting: Obstetrics and Gynecology

## 2015-04-25 ENCOUNTER — Ambulatory Visit (INDEPENDENT_AMBULATORY_CARE_PROVIDER_SITE_OTHER): Payer: BLUE CROSS/BLUE SHIELD

## 2015-04-25 ENCOUNTER — Other Ambulatory Visit: Payer: BLUE CROSS/BLUE SHIELD

## 2015-04-25 ENCOUNTER — Encounter: Payer: Self-pay | Admitting: Obstetrics and Gynecology

## 2015-04-25 ENCOUNTER — Ambulatory Visit (INDEPENDENT_AMBULATORY_CARE_PROVIDER_SITE_OTHER): Payer: BLUE CROSS/BLUE SHIELD | Admitting: Obstetrics and Gynecology

## 2015-04-25 VITALS — BP 120/80 | Ht 61.25 in | Wt 162.5 lb

## 2015-04-25 DIAGNOSIS — N939 Abnormal uterine and vaginal bleeding, unspecified: Secondary | ICD-10-CM

## 2015-04-25 DIAGNOSIS — N920 Excessive and frequent menstruation with regular cycle: Secondary | ICD-10-CM

## 2015-04-25 NOTE — Progress Notes (Signed)
Patient ID: Olivia Church, female   DOB: [birth year], 34 y.o.   MRN: [id number]   [name] Visit  Patient name: Olivia Church MRN [id number]  Date of birth: 1980-07-30  Preoperative History and Physical  [name] is a 34 y.o. [phone], s/p tubal ligation, with a history of DM, here for surgical management of endometrial ablation.   No significant preoperative concerns. Pt was last seen on seen on 8/29 for discussion of possible endometrial ablation. She was started on Provera during that visit and has been taking the medication for 30 days.   Pt has [place] today that showed normal anteverted uterus, normal ovaries and EEC of 3.3 mm. She currently recovering from a URI, which is suspected to be viral. Pt also notes external vaginal irritation. Pt has a history of yeast infections and states current symptoms are consistent.   Proposed surgery: endometrial ablation  Past Medical History  Diagnosis Date  . Anxiety   . Asthma   . Depression   . Diabetes mellitus ([place])     diet controlled  . Chronic UTI (urinary tract infection)   . Bursitis   . Irregular periods 03/04/2015  . Dysmenorrhea 03/04/2015  . Vaginal discharge 03/04/2015   Past Surgical History  Procedure Laterality Date  . Tubal ligation  01-04-01   OB History  Gravida Para Term Preterm AB SAB TAB Ectopic Multiple Living  4 3   1  1   3     # Outcome Date GA Lbr Len/2nd Weight Sex Delivery Anes PTL Lv  4 TAB           3 Para           2 Para           1 Para             Patient denies any other pertinent gynecologic issues.   Current Outpatient Prescriptions on File Prior to Visit  Medication Sig Dispense Refill  . albuterol (PROVENTIL HFA;VENTOLIN HFA) 108 (90 BASE) MCG/ACT inhaler Inhale 2 puffs into the lungs every 6 (six) hours as needed.    . empagliflozin (JARDIANCE) 10 MG TABS tablet Take 10 mg by mouth daily.    . Ginkgo Biloba (GINKOBA PO) Take by mouth daily.    . medroxyPROGESTERone  (PROVERA) 5 MG tablet Take 1 tablet (5 mg total) by mouth daily. 30 tablet 1  . Multiple Vitamin (MULTIVITAMIN) tablet Take 1 tablet by mouth daily.    . phentermine 37.5 MG capsule Take 37.5 mg by mouth daily.     No current facility-administered medications on file prior to visit.   No Known Allergies  Social History:   reports that she has quit smoking. Her smoking use included Cigarettes. She has a 15 pack-year smoking history. She has never used smokeless tobacco. She reports that she drinks alcohol. She reports that she does not use illicit drugs.  Family History  Problem Relation Age of Onset  . Diabetes Mother   . Ovarian cancer Mother   . Colon cancer Neg Hx     Review of Systems: Noncontributory  PHYSICAL EXAM: Blood pressure 120/80, height 5' 1.25" (1.556 m), weight 162 lb 8 oz (73.71 kg), last menstrual period 04/18/2015. General appearance - alert, well appearing, and in no distress Physical Examination: General appearance - alert, well appearing, and in no distress and oriented to person, place, and time Mental status - alert, oriented to person, place, and time, normal mood, behavior, speech, dress,  motor activity, and thought processes Chest - clear to auscultation, no wheezes, rales or rhonchi, symmetric air entry Heart - normal rate, regular rhythm, normal S1, S2, no murmurs, rubs, clicks or gallops Pelvic - normal external genitalia, vulva, vagina, cervix, uterus and adnexa VULVA: normal appearing vulva with no masses, tenderness or lesions  VAGINA: normal appearing vagina with normal color and discharge, no lesions CERVIX: normal appearing cervix without discharge or lesions UTERUS: uterus is normal size, shape, consistency and nontender ADNEXA: normal adnexa in size, nontender and no masses Extremities - peripheral pulses normal, no pedal edema, no clubbing or cyanosis  Labs: No results found for this or any previous visit (from the past 336  hour(s)).  Imaging Studies: US Transvaginal Non-ob  05/15/15   GYNECOLOGIC SONOGRAM   Olivia Church is a 34 y.o. [id number] LMP10/09/2014 for a pelvic sonogram  for abnormal uterine bleeding.  Uterus                      8.4 x 4.7 x 4.0 cm, normal anteverted uterus   Endometrium          3.25 mm, symmetrical, wnl  Right ovary             3.1 x 1.9 x 1.8 cm, wnl  Left ovary                2.6 x 2.3 x 1.5 cm, wnl    Technician Comments:   PELVIC US TA/TV,normal anteverted uterus,normal ov's bilat (mobile),no  free fluid seen,EEC 3.65mm,no pain during ultrasound.   Silver Huguenin 2015-05-15 10:17 AM    US Pelvis Complete  05/15/15   GYNECOLOGIC SONOGRAM   Olivia Church is a 34 y.o. [id number] LMP10/09/2014 for a pelvic sonogram  for abnormal uterine bleeding.  Uterus                      8.4 x 4.7 x 4.0 cm, normal anteverted uterus   Endometrium          3.25 mm, symmetrical, wnl  Right ovary             3.1 x 1.9 x 1.8 cm, wnl  Left ovary                2.6 x 2.3 x 1.5 cm, wnl    Technician Comments:   PELVIC US TA/TV,normal anteverted uterus,normal ov's bilat (mobile),no  free fluid seen,EEC 3.32mm,no pain during ultrasound.   Silver Huguenin May 15, 2015 10:17 AM     Assessment: Patient Active Problem List   Diagnosis Date Noted  . Menorrhagia with irregular cycle 03/14/2015  . Irregular periods 03/04/2015  . Dysmenorrhea 03/04/2015  . Vaginal discharge 03/04/2015  . Acute folliculitis vulva 16/01/3709  . Chronic diarrhea 04/16/2012  . Anxiety 04/16/2012  . Depression 04/16/2012  . Asthma 04/16/2012  . DM (diabetes mellitus) ([place]) 04/16/2012  . Chronic UTI 04/16/2012    Plan: Patient will undergo surgical management with endometrial ablation.      Assessment & Plan:   A:  1. Pelvic and transvaginal US showed normal uterus and ovaries, EEC 3.3 mm  P:  1. Will plan for endometrial ablation on 10/14 2. Will treat pt with Diflucan [name], MD  .mec 05-15-15 11:03  AM  By signing my name below, I, [name], attest that this documentation has been prepared under the direction and in the presence of [name], MD.  Electronically Signed: Barnetta Chapel  Macek, ED Scribe. 04/25/2015. 10:41 AM.

## 2015-04-25 NOTE — Progress Notes (Signed)
PELVIC US TA/TV,normal anteverted uterus,normal ov's bilat (mobile),no free fluid seen,EEC 3.9mm,no pain during ultrasound.

## 2015-04-26 ENCOUNTER — Telehealth: Payer: Self-pay | Admitting: *Deleted

## 2015-04-26 ENCOUNTER — Other Ambulatory Visit: Payer: Self-pay | Admitting: Obstetrics and Gynecology

## 2015-04-26 DIAGNOSIS — N920 Excessive and frequent menstruation with regular cycle: Secondary | ICD-10-CM | POA: Insufficient documentation

## 2015-04-26 MED ORDER — FLUCONAZOLE 150 MG PO TABS: 150.0000 mg | ORAL_TABLET | Freq: Once | ORAL | Status: DC

## 2015-04-26 NOTE — Patient Instructions (Signed)
Olivia Church  04/26/2015     @PREFPERIOPPHARMACY @   Your procedure is scheduled on  04/29/2015  Report to [facility] at  615  A.M.  Call this number if you have problems the morning of surgery:  [phone]   Remember:  Do not eat food or drink liquids after midnight.  Take these medicines the morning of surgery with A SIP OF WATER:   Fluoxetine.   Do not wear jewelry, make-up or nail polish.  Do not wear lotions, powders, or perfumes.  You may wear deodorant.  Do not shave 48 hours prior to surgery.  Men may shave face and neck.  Do not bring valuables to the hospital.  [facility] is not responsible for any belongings or valuables.  Contacts, dentures or bridgework may not be worn into surgery.  Leave your suitcase in the car.  After surgery it may be brought to your room.  For patients admitted to the hospital, discharge time will be determined by your treatment team.  Patients discharged the day of surgery will not be allowed to drive home.   Name and phone number of your driver:   family Special instructions:  none  Please read over the following fact sheets that you were given. Pain Booklet, Coughing and Deep Breathing, Surgical Site Infection Prevention, Anesthesia Post-op Instructions and Care and Recovery After Surgery      Dilation and Curettage or Vacuum Curettage Dilation and curettage (D&C) and vacuum curettage are minor procedures. A D&C involves stretching (dilation) the cervix and scraping (curettage) the inside lining of the womb (uterus). During a D&C, tissue is gently scraped from the inside lining of the uterus. During a vacuum curettage, the lining and tissue in the uterus are removed with the use of gentle suction.  Curettage may be performed to either diagnose or treat a problem. As a diagnostic procedure, curettage is performed to examine tissues from the uterus. A diagnostic curettage may be performed for the following symptoms:     Irregular bleeding in the uterus.   Bleeding with the development of clots.   Spotting between menstrual periods.   Prolonged menstrual periods.   Bleeding after menopause.   No menstrual period (amenorrhea).   A change in size and shape of the uterus.  As a treatment procedure, curettage may be performed for the following reasons:   Removal of an IUD (intrauterine device).   Removal of retained placenta after giving birth. Retained placenta can cause an infection or bleeding severe enough to require transfusions.   Abortion.   Miscarriage.   Removal of polyps inside the uterus.   Removal of uncommon types of noncancerous lumps (fibroids).  LET [facility] CARE PROVIDER KNOW ABOUT:   Any allergies you have.   All medicines you are taking, including vitamins, herbs, eye drops, creams, and over-the-counter medicines.   Previous problems you or members of your family have had with the use of anesthetics.   Any blood disorders you have.   Previous surgeries you have had.   Medical conditions you have. RISKS AND COMPLICATIONS  Generally, this is a safe procedure. However, as with any procedure, complications can occur. Possible complications include:  Excessive bleeding.   Infection of the uterus.   Damage to the cervix.   Development of scar tissue (adhesions) inside the uterus, later causing abnormal amounts of menstrual bleeding.   Complications from the general anesthetic, if a general anesthetic is used.  Putting a hole (perforation) in the uterus. This is rare.  BEFORE THE PROCEDURE   Eat and drink before the procedure only as directed by your health care provider.   Arrange for someone to take you home.  PROCEDURE  This procedure usually takes about 15-30 minutes.  You will be given one of the following:  A medicine that numbs the area in and around the cervix (local anesthetic).   A medicine to make you sleep through  the procedure (general anesthetic).  You will lie on your back with your legs in stirrups.   A warm metal or plastic instrument (speculum) will be placed in your vagina to keep it open and to allow the health care provider to see the cervix.  There are two ways in which your cervix can be softened and dilated. These include:   Taking a medicine.   Having thin rods (laminaria) inserted into your cervix.   A curved tool (curette) will be used to scrape cells from the inside lining of the uterus. In some cases, gentle suction is applied with the curette. The curette will then be removed.  AFTER THE PROCEDURE   You will rest in the recovery area until you are stable and are ready to go home.   You may feel sick to your stomach (nauseous) or throw up (vomit) if you were given a general anesthetic.   You may have a sore throat if a tube was placed in your throat during general anesthesia.   You may have light cramping and bleeding. This may last for 2 days to 2 weeks after the procedure.   Your uterus needs to make a new lining after the procedure. This may make your next period late.   This information is not intended to replace advice given to you by your health care provider. Make sure you discuss any questions you have with your health care provider.   Document Released: 07/02/2005 Document Revised: 03/04/2013 Document Reviewed: 01/29/2013 Elsevier Interactive Patient Education [employer]. Hysteroscopy Hysteroscopy is a procedure used for looking inside the womb (uterus). It may be done for various reasons, including:  To evaluate abnormal bleeding, fibroid (benign, noncancerous) tumors, polyps, scar tissue (adhesions), and possibly cancer of the uterus.  To look for lumps (tumors) and other uterine growths.  To look for causes of why a woman cannot get pregnant (infertility), causes of recurrent loss of pregnancy (miscarriages), or a lost intrauterine device  (IUD).  To perform a sterilization by blocking the fallopian tubes from inside the uterus. In this procedure, a thin, flexible tube with a tiny light and camera on the end of it (hysteroscope) is used to look inside the uterus. A hysteroscopy should be done right after a menstrual period to be sure you are not pregnant. LET [facility] CARE PROVIDER KNOW ABOUT:   Any allergies you have.  All medicines you are taking, including vitamins, herbs, eye drops, creams, and over-the-counter medicines.  Previous problems you or members of your family have had with the use of anesthetics.  Any blood disorders you have.  Previous surgeries you have had.  Medical conditions you have. RISKS AND COMPLICATIONS  Generally, this is a safe procedure. However, as with any procedure, complications can occur. Possible complications include:  Putting a hole in the uterus.  Excessive bleeding.  Infection.  Damage to the cervix.  Injury to other organs.  Allergic reaction to medicines.  Too much fluid used in the uterus for the procedure.  BEFORE THE PROCEDURE   Ask your health care provider about changing or stopping any regular medicines.  Do not take aspirin or blood thinners for 1 week before the procedure, or as directed by your health care provider. These can cause bleeding.  If you smoke, do not smoke for 2 weeks before the procedure.  In some cases, a medicine is placed in the cervix the day before the procedure. This medicine makes the cervix have a larger opening (dilate). This makes it easier for the instrument to be inserted into the uterus during the procedure.  Do not eat or drink anything for at least 8 hours before the surgery.  Arrange for someone to take you home after the procedure. PROCEDURE   You may be given a medicine to relax you (sedative). You may also be given one of the following:  A medicine that numbs the area around the cervix (local anesthetic).  A medicine  that makes you sleep through the procedure (general anesthetic).  The hysteroscope is inserted through the vagina into the uterus. The camera on the hysteroscope sends a picture to a TV screen. This gives the surgeon a good view inside the uterus.  During the procedure, air or a liquid is put into the uterus, which allows the surgeon to see better.  Sometimes, tissue is gently scraped from inside the uterus. These tissue samples are sent to a lab for testing. AFTER THE PROCEDURE   If you had a general anesthetic, you may be groggy for a couple hours after the procedure.  If you had a local anesthetic, you will be able to go home as soon as you are stable and feel ready.  You may have some cramping. This normally lasts for a couple days.  You may have bleeding, which varies from light spotting for a few days to menstrual-like bleeding for 3-7 days. This is normal.  If your test results are not back during the visit, make an appointment with your health care provider to find out the results.   This information is not intended to replace advice given to you by your health care provider. Make sure you discuss any questions you have with your health care provider.   Document Released: 10/08/2000 Document Revised: 04/22/2013 Document Reviewed: 01/29/2013 Elsevier Interactive Patient Education 2016 [place]. Endometrial Ablation Endometrial ablation removes the lining of the uterus (endometrium). It is usually a same-day, outpatient treatment. Ablation helps avoid major surgery, such as surgery to remove the cervix and uterus (hysterectomy). After endometrial ablation, you will have little or no menstrual bleeding and may not be able to have children. However, if you are premenopausal, you will need to use a reliable method of birth control following the procedure because of the small chance that pregnancy can occur. There are different reasons to have this procedure. These reasons  include:  Heavy periods.  Bleeding that is causing anemia.  Irregular bleeding.  Bleeding fibroids on the lining inside the uterus if they are smaller than 3 centimeters. This procedure may not be possible for you if:   You want to have children in the future.   You have severe cramps with your menstrual period.   You have precancerous or cancerous cells in your uterus.   You were recently pregnant.   You have gone through menopause.   You have had major surgery on your uterus, resulting in thinning of the uterine wall. Surgeries may include:  The removal of one or more uterine fibroids (  myomectomy).  A cesarean section with a classic (vertical) incision on your uterus. Ask your health care provider what type of cesarean you had. Sometimes the scar on your skin is different than the scar on your uterus. Even if you have had surgery on your uterus, certain types of ablation may still be safe for you. Talk with your health care provider. LET [facility] CARE PROVIDER KNOW ABOUT:  Any allergies you have.  All medicines you are taking, including vitamins, herbs, eye drops, creams, and over-the-counter medicines.  Previous problems you or members of your family have had with the use of anesthetics.  Any blood disorders you have.  Previous surgeries you have had.  Medical conditions you have. RISKS AND COMPLICATIONS  Generally, this is a safe procedure. However, as with any procedure, complications can occur. Possible complications include:  Perforation of the uterus.  Bleeding.  Infection of the uterus, bladder, or vagina.  Injury to surrounding organs.  An air bubble to the lung (air embolus).  Pregnancy following the procedure.  Failure of the procedure to help the problem, requiring hysterectomy.  Decreased ability to diagnose cancer in the lining of the uterus. BEFORE THE PROCEDURE  The lining of the uterus must be tested to make sure there is no  pre-cancerous or cancer cells present.  An ultrasound may be performed to look at the size of the uterus and to check for abnormalities.  Medicines may be given to thin the lining of the uterus. PROCEDURE  During the procedure, your health care provider will use a tool called a resectoscope to help see inside your uterus. There are different ways to remove the lining of your uterus.   Radiofrequency - This method uses a radiofrequency-alternating electric current to remove the lining of the uterus.  Cryotherapy - This method uses extreme cold to freeze the lining of the uterus.  Heated-Free Liquid - This method uses heated salt (saline) solution to remove the lining of the uterus.  Microwave - This method uses high-energy microwaves to heat up the lining of the uterus to remove it.  Thermal balloon - This method involves inserting a catheter with a balloon tip into the uterus. The balloon tip is filled with heated fluid to remove the lining of the uterus. AFTER THE PROCEDURE  After your procedure, do not have sexual intercourse or insert anything into your vagina until permitted by your health care provider. After the procedure, you may experience:  Cramps.  Vaginal discharge.  Frequent urination.   This information is not intended to replace advice given to you by your health care provider. Make sure you discuss any questions you have with your health care provider.   Document Released: 05/11/2004 Document Revised: 03/23/2015 Document Reviewed: 12/03/2012 Elsevier Interactive Patient Education 2016 [employer] PATIENT INSTRUCTIONS POST-ANESTHESIA  IMMEDIATELY FOLLOWING SURGERY:  Do not drive or operate machinery for the first twenty four hours after surgery.  Do not make any important decisions for twenty four hours after surgery or while taking narcotic pain medications or sedatives.  If you develop intractable nausea and vomiting or a severe headache please notify your doctor  immediately.  FOLLOW-UP:  Please make an appointment with your surgeon as instructed. You do not need to follow up with anesthesia unless specifically instructed to do so.  WOUND CARE INSTRUCTIONS (if applicable):  Keep a dry clean dressing on the anesthesia/puncture wound site if there is drainage.  Once the wound has quit draining you may leave it open  to air.  Generally you should leave the bandage intact for twenty four hours unless there is drainage.  If the epidural site drains for more than 36-48 hours please call the anesthesia department.  QUESTIONS?:  Please feel free to call your physician or the hospital operator if you have any questions, and they will be happy to assist you.

## 2015-04-26 NOTE — Telephone Encounter (Signed)
Pt aware that Rx for Diflucan has been sent to her pharmacy.

## 2015-04-27 ENCOUNTER — Other Ambulatory Visit: Payer: Self-pay | Admitting: Obstetrics and Gynecology

## 2015-04-27 ENCOUNTER — Other Ambulatory Visit: Payer: Self-pay

## 2015-04-27 ENCOUNTER — Encounter (HOSPITAL_COMMUNITY): Payer: Self-pay

## 2015-04-27 ENCOUNTER — Encounter (HOSPITAL_COMMUNITY)
Admission: RE | Admit: 2015-04-27 | Discharge: 2015-04-27 | Disposition: A | Discharge status: Home / Self Care | Payer: BLUE CROSS/BLUE SHIELD | Source: Ambulatory Visit | Attending: Obstetrics and Gynecology | Admitting: Obstetrics and Gynecology

## 2015-04-27 DIAGNOSIS — Z01812 Encounter for preprocedural laboratory examination: Secondary | ICD-10-CM | POA: Diagnosis not present

## 2015-04-27 DIAGNOSIS — J45909 Unspecified asthma, uncomplicated: Secondary | ICD-10-CM | POA: Diagnosis not present

## 2015-04-27 DIAGNOSIS — N920 Excessive and frequent menstruation with regular cycle: Secondary | ICD-10-CM | POA: Diagnosis present

## 2015-04-27 DIAGNOSIS — Z01810 Encounter for preprocedural cardiovascular examination: Secondary | ICD-10-CM | POA: Diagnosis not present

## 2015-04-27 DIAGNOSIS — Z87891 Personal history of nicotine dependence: Secondary | ICD-10-CM | POA: Diagnosis not present

## 2015-04-27 DIAGNOSIS — Z79899 Other long term (current) drug therapy: Secondary | ICD-10-CM | POA: Diagnosis not present

## 2015-04-27 DIAGNOSIS — E119 Type 2 diabetes mellitus without complications: Secondary | ICD-10-CM | POA: Diagnosis not present

## 2015-04-27 DIAGNOSIS — F418 Other specified anxiety disorders: Secondary | ICD-10-CM | POA: Diagnosis not present

## 2015-04-27 LAB — CBC WITH DIFFERENTIAL/PLATELET
Basophils Absolute: 0 10*3/uL (ref 0.0–0.1)
Basophils Relative: 0 %
EOS ABS: 0.2 10*3/uL (ref 0.0–0.7)
EOS PCT: 2 %
HCT: 43.3 % (ref 36.0–46.0)
HEMOGLOBIN: 14.9 g/dL (ref 12.0–15.0)
LYMPHS ABS: 4.6 10*3/uL — AB (ref 0.7–4.0)
Lymphocytes Relative: 35 %
MCH: 30.7 pg (ref 26.0–34.0)
MCHC: 34.4 g/dL (ref 30.0–36.0)
MCV: 89.1 fL (ref 78.0–100.0)
MONOS PCT: 8 %
Monocytes Absolute: 1 10*3/uL (ref 0.1–1.0)
Neutro Abs: 7.2 10*3/uL (ref 1.7–7.7)
Neutrophils Relative %: 55 %
PLATELETS: 261 10*3/uL (ref 150–400)
RBC: 4.86 MIL/uL (ref 3.87–5.11)
RDW: 12.3 % (ref 11.5–15.5)
WBC: 13 10*3/uL — ABNORMAL HIGH (ref 4.0–10.5)

## 2015-04-27 LAB — BASIC METABOLIC PANEL
ANION GAP: 8 (ref 5–15)
BUN: 8 mg/dL (ref 6–20)
CO2: 24 mmol/L (ref 22–32)
CREATININE: 0.63 mg/dL (ref 0.44–1.00)
Calcium: 9.6 mg/dL (ref 8.9–10.3)
Chloride: 105 mmol/L (ref 101–111)
GFR calc Af Amer: >60 mL/min (ref >60)
GFR calc non Af Amer: >60 mL/min (ref >60)
GLUCOSE: 151 mg/dL — AB (ref 65–99)
Potassium: 4 mmol/L (ref 3.5–5.1)
Sodium: 137 mmol/L (ref 135–145)

## 2015-04-27 LAB — HCG, SERUM, QUALITATIVE: PREG SERUM: NEGATIVE

## 2015-04-27 NOTE — Pre-Procedure Instructions (Signed)
Patient given information to sign up for my chart at home. 

## 2015-04-29 ENCOUNTER — Ambulatory Visit (HOSPITAL_COMMUNITY): Payer: BLUE CROSS/BLUE SHIELD | Admitting: Anesthesiology

## 2015-04-29 ENCOUNTER — Telehealth: Payer: Self-pay | Admitting: *Deleted

## 2015-04-29 ENCOUNTER — Ambulatory Visit (HOSPITAL_COMMUNITY)
Admission: RE | Admit: 2015-04-29 | Discharge: 2015-04-29 | Disposition: A | Discharge status: Home / Self Care | Payer: BLUE CROSS/BLUE SHIELD | Source: Ambulatory Visit | Attending: Obstetrics and Gynecology | Admitting: Obstetrics and Gynecology

## 2015-04-29 ENCOUNTER — Encounter (HOSPITAL_COMMUNITY): Payer: Self-pay | Admitting: *Deleted

## 2015-04-29 ENCOUNTER — Encounter (HOSPITAL_COMMUNITY)
Admission: RE | Disposition: A | Discharge status: Home / Self Care | Payer: Self-pay | Source: Ambulatory Visit | Attending: Obstetrics and Gynecology

## 2015-04-29 DIAGNOSIS — N840 Polyp of corpus uteri: Secondary | ICD-10-CM | POA: Diagnosis not present

## 2015-04-29 DIAGNOSIS — F418 Other specified anxiety disorders: Secondary | ICD-10-CM | POA: Insufficient documentation

## 2015-04-29 DIAGNOSIS — Z01810 Encounter for preprocedural cardiovascular examination: Secondary | ICD-10-CM | POA: Insufficient documentation

## 2015-04-29 DIAGNOSIS — N920 Excessive and frequent menstruation with regular cycle: Secondary | ICD-10-CM | POA: Diagnosis present

## 2015-04-29 DIAGNOSIS — J45909 Unspecified asthma, uncomplicated: Secondary | ICD-10-CM | POA: Insufficient documentation

## 2015-04-29 DIAGNOSIS — Z01812 Encounter for preprocedural laboratory examination: Secondary | ICD-10-CM | POA: Insufficient documentation

## 2015-04-29 DIAGNOSIS — Z87891 Personal history of nicotine dependence: Secondary | ICD-10-CM | POA: Insufficient documentation

## 2015-04-29 DIAGNOSIS — E119 Type 2 diabetes mellitus without complications: Secondary | ICD-10-CM | POA: Insufficient documentation

## 2015-04-29 DIAGNOSIS — Z79899 Other long term (current) drug therapy: Secondary | ICD-10-CM | POA: Insufficient documentation

## 2015-04-29 HISTORY — PX: DILITATION & CURRETTAGE/HYSTROSCOPY WITH NOVASURE ABLATION: SHX5568

## 2015-04-29 LAB — GLUCOSE, CAPILLARY
GLUCOSE-CAPILLARY: 172 mg/dL — AB (ref 65–99)
Glucose-Capillary: 147 mg/dL — ABNORMAL HIGH (ref 65–99)

## 2015-04-29 SURGERY — DILATATION & CURETTAGE/HYSTEROSCOPY WITH NOVASURE ABLATION
Anesthesia: General

## 2015-04-29 MED ORDER — LACTATED RINGERS IV SOLN
INTRAVENOUS | Status: DC
  Administered 2015-04-29 (×2): via INTRAVENOUS

## 2015-04-29 MED ORDER — PROPOFOL 10 MG/ML IV BOLUS
INTRAVENOUS | Status: AC
  Filled 2015-04-29: qty 20

## 2015-04-29 MED ORDER — BUPIVACAINE-EPINEPHRINE 0.5% -1:200000 IJ SOLN
INTRAMUSCULAR | Status: DC | PRN
  Administered 2015-04-29: 28 mL

## 2015-04-29 MED ORDER — ONDANSETRON HCL 4 MG/2ML IJ SOLN
4.0000 mg | Freq: Once | INTRAMUSCULAR | Status: AC
  Administered 2015-04-29: 4 mg via INTRAVENOUS
  Filled 2015-04-29: qty 2

## 2015-04-29 MED ORDER — BUPIVACAINE-EPINEPHRINE (PF) 0.5% -1:200000 IJ SOLN
INTRAMUSCULAR | Status: AC
Start: 2015-04-29 — End: 2015-04-29
  Filled 2015-04-29: qty 30

## 2015-04-29 MED ORDER — GLYCOPYRROLATE 0.2 MG/ML IJ SOLN
0.2000 mg | Freq: Once | INTRAMUSCULAR | Status: AC
  Administered 2015-04-29: 0.2 mg via INTRAVENOUS
  Filled 2015-04-29: qty 1

## 2015-04-29 MED ORDER — KETOROLAC TROMETHAMINE 10 MG PO TABS: 10.0000 mg | ORAL_TABLET | Freq: Four times a day (QID) | ORAL | Status: DC | PRN

## 2015-04-29 MED ORDER — ONDANSETRON HCL 4 MG/2ML IJ SOLN: 4.0000 mg | Freq: Once | INTRAMUSCULAR | Status: DC | PRN

## 2015-04-29 MED ORDER — FENTANYL CITRATE (PF) 250 MCG/5ML IJ SOLN
INTRAMUSCULAR | Status: AC
  Filled 2015-04-29: qty 25

## 2015-04-29 MED ORDER — LIDOCAINE HCL (PF) 1 % IJ SOLN
INTRAMUSCULAR | Status: AC
  Filled 2015-04-29: qty 5

## 2015-04-29 MED ORDER — FENTANYL CITRATE (PF) 100 MCG/2ML IJ SOLN: 25.0000 ug | INTRAMUSCULAR | Status: DC | PRN

## 2015-04-29 MED ORDER — MIDAZOLAM HCL 2 MG/2ML IJ SOLN
1.0000 mg | INTRAMUSCULAR | Status: DC | PRN
  Administered 2015-04-29: 2 mg via INTRAVENOUS
  Filled 2015-04-29: qty 2

## 2015-04-29 MED ORDER — SODIUM CHLORIDE 0.9 % IR SOLN
Status: DC | PRN
  Administered 2015-04-29: 1000 mL

## 2015-04-29 MED ORDER — OXYCODONE-ACETAMINOPHEN 5-325 MG PO TABS: 1.0000 | ORAL_TABLET | Freq: Four times a day (QID) | ORAL | Status: DC | PRN

## 2015-04-29 MED ORDER — PROPOFOL 10 MG/ML IV BOLUS
INTRAVENOUS | Status: DC | PRN
  Administered 2015-04-29: 140 mg via INTRAVENOUS

## 2015-04-29 MED ORDER — ALBUTEROL SULFATE HFA 108 (90 BASE) MCG/ACT IN AERS
INHALATION_SPRAY | RESPIRATORY_TRACT | Status: AC
  Filled 2015-04-29: qty 6.7

## 2015-04-29 MED ORDER — ALBUTEROL SULFATE HFA 108 (90 BASE) MCG/ACT IN AERS
INHALATION_SPRAY | RESPIRATORY_TRACT | Status: DC | PRN
  Administered 2015-04-29: 5 via RESPIRATORY_TRACT

## 2015-04-29 MED ORDER — FENTANYL CITRATE (PF) 100 MCG/2ML IJ SOLN
INTRAMUSCULAR | Status: DC | PRN
  Administered 2015-04-29: 75 ug via INTRAVENOUS
  Administered 2015-04-29: 25 ug via INTRAVENOUS
  Administered 2015-04-29 (×2): 50 ug via INTRAVENOUS

## 2015-04-29 MED ORDER — MIDAZOLAM HCL 2 MG/2ML IJ SOLN
INTRAMUSCULAR | Status: AC
  Filled 2015-04-29: qty 4

## 2015-04-29 MED ORDER — LIDOCAINE HCL 1 % IJ SOLN
INTRAMUSCULAR | Status: DC | PRN
  Administered 2015-04-29: 35 mg via INTRADERMAL

## 2015-04-29 MED ORDER — FENTANYL CITRATE (PF) 100 MCG/2ML IJ SOLN
25.0000 ug | INTRAMUSCULAR | Status: AC
  Administered 2015-04-29: 25 ug via INTRAVENOUS
  Filled 2015-04-29: qty 2

## 2015-04-29 MED ORDER — MIDAZOLAM HCL 5 MG/5ML IJ SOLN
INTRAMUSCULAR | Status: DC | PRN
  Administered 2015-04-29: 2 mg via INTRAVENOUS

## 2015-04-29 SURGICAL SUPPLY — 30 items
ABLATOR ENDOMETRIAL BIPOLAR (ABLATOR) ×3 IMPLANT
BAG HAMPER (MISCELLANEOUS) ×2 IMPLANT
CATH ROBINSON RED A/P 16FR (CATHETERS) ×1 IMPLANT
CLOTH BEACON ORANGE TIMEOUT ST (SAFETY) ×2 IMPLANT
COVER LIGHT HANDLE STERIS (MISCELLANEOUS) ×4 IMPLANT
DECANTER SPIKE VIAL GLASS SM (MISCELLANEOUS) ×2 IMPLANT
FORMALIN 10 PREFIL 120ML (MISCELLANEOUS) ×2 IMPLANT
GLOVE BIOGEL PI IND STRL 7.0 (GLOVE) IMPLANT
GLOVE BIOGEL PI IND STRL 9 (GLOVE) ×1 IMPLANT
GLOVE BIOGEL PI INDICATOR 7.0 (GLOVE) ×1
GLOVE BIOGEL PI INDICATOR 9 (GLOVE) ×1
GLOVE ECLIPSE 9.0 STRL (GLOVE) ×2 IMPLANT
GLOVE EXAM NITRILE MD LF STRL (GLOVE) ×1 IMPLANT
GLOVE SS BIOGEL STRL SZ 6.5 (GLOVE) IMPLANT
GLOVE SUPERSENSE BIOGEL SZ 6.5 (GLOVE) ×1
GOWN SPEC L3 XXLG W/TWL (GOWN DISPOSABLE) ×2 IMPLANT
GOWN STRL REUS W/TWL LRG LVL3 (GOWN DISPOSABLE) ×2 IMPLANT
INST SET HYSTEROSCOPY (KITS) ×2 IMPLANT
IV NS 1000ML (IV SOLUTION) ×2
IV NS 1000ML BAXH (IV SOLUTION) ×1 IMPLANT
KIT ROOM TURNOVER AP CYSTO (KITS) ×2 IMPLANT
KIT ROOM TURNOVER APOR (KITS) ×2 IMPLANT
MANIFOLD NEPTUNE II (INSTRUMENTS) ×2 IMPLANT
NS IRRIG 1000ML POUR BTL (IV SOLUTION) ×2 IMPLANT
PACK PERI GYN (CUSTOM PROCEDURE TRAY) ×2 IMPLANT
PAD ARMBOARD 7.5X6 YLW CONV (MISCELLANEOUS) ×2 IMPLANT
PAD TELFA 3X4 1S STER (GAUZE/BANDAGES/DRESSINGS) ×2 IMPLANT
SET BASIN LINEN APH (SET/KITS/TRAYS/PACK) ×2 IMPLANT
SET IRRIG Y TYPE TUR BLADDER L (SET/KITS/TRAYS/PACK) ×2 IMPLANT
SYR CONTROL 10ML LL (SYRINGE) ×2 IMPLANT

## 2015-04-29 NOTE — Brief Op Note (Signed)
04/29/2015  8:56 AM  PATIENT:  Olivia Church  34 y.o. female  PRE-OPERATIVE DIAGNOSIS:  menorrhagia  POST-OPERATIVE DIAGNOSIS:  menorrhagia  PROCEDURE:  Procedure(s): DILATATION & CURETTAGE/HYSTEROSCOPY WITH NOVASURE ABLATION (N/A)  SURGEON:  Surgeon(s) and Role:    * [name], MD - Primary  PHYSICIAN ASSISTANT:   ASSISTANTS: Angela witt CST   ANESTHESIA:   local and general  EBL:  Total I/O In: 1100 [I.V.:1100] Out: -   BLOOD ADMINISTERED:none  DRAINS: none   LOCAL MEDICATIONS USED:  MARCAINE    and Amount: 28 ml  SPECIMEN:  Source of Specimen:  Endometrial curettings  DISPOSITION OF SPECIMEN:  PATHOLOGY  COUNTS:  YES  TOURNIQUET:  * No tourniquets in log *  DICTATION: .Dragon Dictation  PLAN OF CARE: Discharge to home after PACU  PATIENT DISPOSITION:  PACU - hemodynamically stable.   Delay start of Pharmacological VTE agent (>24hrs) due to surgical blood loss or risk of bleeding: not applicable Details of procedure:. Patient was taken operating room prepped and draped for vaginal procedure with legs in standard low lithotomy support. Cervix was grasped with single-tooth tenaculum, the uterus sounded in the anteflexed position to 9 cm dilated to 64 [place] and hysteroscopy performed with the 30 operative hysteroscope identifying a smooth endometrial cavity with both tubal ostia visualized endometrial tissue present. A brief endometrial curettage was performed obtaining thin fragments of normal appearing endometrial tissue and we then sounded the endometrial cavity to a depth of 5.5 cm using the NovaSure sounding device. The NovaSure device was then tested, and an alert showing cavity integrity was alerting. There had been no suspicion of improper placement of the device. The control apparatus was checked out, the CO2 cartridge changed out,, and a new NovaSure device utilized placed in position tested and confirmed as passing all tests. The NovaSure ablation  sequence was then activated for 1 minute 36 seconds at a power of 121 W. The uterine cavity width was 4.0 cm and uterine cavity length 5.5 cm. Procedure was satisfactorily completed the NovaSure device checked for integrity at the end of the procedure and appeared normal,patient was then allowed to awaken and go to recovery room in standard fashion.Marland Kitchen

## 2015-04-29 NOTE — Anesthesia Preprocedure Evaluation (Signed)
Anesthesia Evaluation  Patient identified by MRN, date of birth, ID band Patient awake    Reviewed: Allergy & Precautions, NPO status , Patient's Chart, lab work & pertinent test results  Airway Mallampati: II  TM Distance: >3 FB   Mouth opening: Limited Mouth Opening  Dental  (+) Teeth Intact, Partial Upper   Pulmonary asthma , Current Smoker,  Allergies, cough   breath sounds clear to auscultation       Cardiovascular negative cardio ROS   Rhythm:Regular Rate:Normal     Neuro/Psych PSYCHIATRIC DISORDERS Anxiety Depression    GI/Hepatic negative GI ROS,   Endo/Other  diabetes, Type 2, Oral Hypoglycemic Agents  Renal/GU      Musculoskeletal   Abdominal   Peds  Hematology   Anesthesia Other Findings   Reproductive/Obstetrics                             Anesthesia Physical Anesthesia Plan  ASA: III  Anesthesia Plan: General   Post-op Pain Management:    Induction: Intravenous  Airway Management Planned: LMA  Additional Equipment:   Intra-op Plan:   Post-operative Plan: Extubation in OR  Informed Consent: I have reviewed the patients History and Physical, chart, labs and discussed the procedure including the risks, benefits and alternatives for the proposed anesthesia with the patient or authorized representative who has indicated his/her understanding and acceptance.     Plan Discussed with:   Anesthesia Plan Comments:         Anesthesia Quick Evaluation

## 2015-04-29 NOTE — Anesthesia Procedure Notes (Signed)
Procedure Name: LMA Insertion Date/Time: 04/29/2015 7:48 AM Performed by: Charmaine Downs Pre-anesthesia Checklist: Patient identified, Emergency Drugs available, Suction available and Patient being monitored Patient Re-evaluated:Patient Re-evaluated prior to inductionOxygen Delivery Method: Circle system utilized Preoxygenation: Pre-oxygenation with 100% oxygen Intubation Type: IV induction Ventilation: Mask ventilation without difficulty LMA: LMA inserted LMA Size: 3.0 Grade View: Grade II Number of attempts: 1 Placement Confirmation: positive ETCO2,  CO2 detector and breath sounds checked- equal and bilateral Tube secured with: Tape Dental Injury: Teeth and Oropharynx as per pre-operative assessment  Difficulty Due To: Difficulty was anticipated and Difficult Airway- due to limited oral opening

## 2015-04-29 NOTE — Transfer of Care (Signed)
Immediate Anesthesia Transfer of Care Note  Patient: Olivia Church  Procedure(s) Performed: Procedure(s): DILATATION & CURETTAGE/HYSTEROSCOPY WITH NOVASURE ABLATION (N/A)  Patient Location: PACU  Anesthesia Type:General  Level of Consciousness: awake, alert  and patient cooperative  Airway & Oxygen Therapy: Patient Spontanous Breathing and Patient connected to face mask oxygen  Post-op Assessment: Report given to RN, Post -op Vital signs reviewed and stable and Patient moving all extremities  Post vital signs: Reviewed and stable  Last Vitals:  Filed Vitals:   04/29/15 0700  BP: 114/79  Temp:   Resp: 21    Complications: No apparent anesthesia complications

## 2015-04-29 NOTE — Telephone Encounter (Signed)
Work note faxed to number provided by pt.

## 2015-04-29 NOTE — Anesthesia Postprocedure Evaluation (Signed)
  Anesthesia Post-op Note  Patient: Olivia Church  Procedure(s) Performed: Procedure(s): DILATATION & CURETTAGE/HYSTEROSCOPY WITH NOVASURE ABLATION (N/A)  Patient Location: PACU  Anesthesia Type:General  Level of Consciousness: awake, alert , oriented and patient cooperative  Airway and Oxygen Therapy: Patient Spontanous Breathing  Post-op Pain: none  Post-op Assessment: Post-op Vital signs reviewed, Patient's Cardiovascular Status Stable, Respiratory Function Stable, Patent Airway, No signs of Nausea or vomiting and Pain level controlled              Post-op Vital Signs: Reviewed and stable  Last Vitals:  Filed Vitals:   04/29/15 0700  BP: 114/79  Temp:   Resp: 21    Complications: No apparent anesthesia complications

## 2015-04-29 NOTE — Discharge Instructions (Signed)
Endometrial Ablation °Endometrial ablation removes the lining of the uterus (endometrium). It is usually a same-day, outpatient treatment. Ablation helps avoid major surgery, such as surgery to remove the cervix and uterus (hysterectomy). After endometrial ablation, you will have little or no menstrual bleeding and may not be able to have children. However, if you are premenopausal, you will need to use a reliable method of birth control following the procedure because of the small chance that pregnancy can occur. °There are different reasons to have this procedure. These reasons include: °· Heavy periods. °· Bleeding that is causing anemia. °· Irregular bleeding. °· Bleeding fibroids on the lining inside the uterus if they are smaller than 3 centimeters. °This procedure may not be possible for you if:  °· You want to have children in the future.   °· You have severe cramps with your menstrual period.   °· You have precancerous or cancerous cells in your uterus.   °· You were recently pregnant.   °· You have gone through menopause.   °· You have had major surgery on your uterus, resulting in thinning of the uterine wall. Surgeries may include: °¨ The removal of one or more uterine fibroids (myomectomy). °¨ A cesarean section with a classic (vertical) incision on your uterus. Ask your health care provider what type of cesarean you had. Sometimes the scar on your skin is different than the scar on your uterus. °Even if you have had surgery on your uterus, certain types of ablation may still be safe for you. Talk with your health care provider. °LET YOUR HEALTH CARE PROVIDER KNOW ABOUT: °· Any allergies you have. °· All medicines you are taking, including vitamins, herbs, eye drops, creams, and over-the-counter medicines. °· Previous problems you or members of your family have had with the use of anesthetics. °· Any blood disorders you have. °· Previous surgeries you have had. °· Medical conditions you have. °RISKS AND  COMPLICATIONS  °Generally, this is a safe procedure. However, as with any procedure, complications can occur. Possible complications include: °· Perforation of the uterus. °· Bleeding. °· Infection of the uterus, bladder, or vagina. °· Injury to surrounding organs. °· An air bubble to the lung (air embolus). °· Pregnancy following the procedure. °· Failure of the procedure to help the problem, requiring hysterectomy. °· Decreased ability to diagnose cancer in the lining of the uterus. °BEFORE THE PROCEDURE °· The lining of the uterus must be tested to make sure there is no pre-cancerous or cancer cells present. °· An ultrasound may be performed to look at the size of the uterus and to check for abnormalities. °· Medicines may be given to thin the lining of the uterus. °PROCEDURE  °During the procedure, your health care provider will use a tool called a resectoscope to help see inside your uterus. There are different ways to remove the lining of your uterus.  °· Radiofrequency - This method uses a radiofrequency-alternating electric current to remove the lining of the uterus. °· Cryotherapy - This method uses extreme cold to freeze the lining of the uterus. °· Heated-Free Liquid - This method uses heated salt (saline) solution to remove the lining of the uterus. °· Microwave - This method uses high-energy microwaves to heat up the lining of the uterus to remove it. °· Thermal balloon - This method involves inserting a catheter with a balloon tip into the uterus. The balloon tip is filled with heated fluid to remove the lining of the uterus. °AFTER THE PROCEDURE  °After your procedure, do   not have sexual intercourse or insert anything into your vagina until permitted by your health care provider. After the procedure, you may experience: °· Cramps. °· Vaginal discharge. °· Frequent urination. °  °This information is not intended to replace advice given to you by your health care provider. Make sure you discuss any  questions you have with your health care provider. °  °Document Released: 05/11/2004 Document Revised: 03/23/2015 Document Reviewed: 12/03/2012 °Elsevier Interactive Patient Education ©2016 Elsevier Inc. ° °

## 2015-04-29 NOTE — H&P (Signed)
Patient ID: Olivia Church, female DOB: 12-Jul-1981, 34 y.o. MRN: [id number]  [facility] Visit  Patient name: Olivia HarrisMRN [id number] Date of birth: 04-29-1981  Preoperative History and Physical  Elner Seifert is a 34 y.o. [phone], s/p tubal ligation, with a history of DM, here for surgical management of endometrial ablation. No significant preoperative concerns. Pt was last seen on seen on 8/29 for discussion of possible endometrial ablation. She was started on Provera during that visit and has been taking the medication for 30 days.   Pt has [place] today that showed normal anteverted uterus, normal ovaries and EEC of 3.3 mm. She currently recovering from a URI, which is suspected to be viral. Pt also notes external vaginal irritation. Pt has a history of yeast infections and states current symptoms are consistent.   Proposed surgery: endometrial ablation  Past Medical History  Diagnosis Date  . Anxiety   . Asthma   . Depression   . Diabetes mellitus ([place])     diet controlled  . Chronic UTI (urinary tract infection)   . Bursitis   . Irregular periods 03/04/2015  . Dysmenorrhea 03/04/2015  . Vaginal discharge 03/04/2015   Past Surgical History  Procedure Laterality Date  . Tubal ligation  01-04-01   OB History  Gravida Para Term Preterm AB SAB TAB Ectopic Multiple Living  4 3   1  1   3     # Outcome Date GA Lbr Len/2nd Weight Sex Delivery Anes PTL Lv  4 TAB           3 Para           2 Para           1 Para             Patient denies any other pertinent gynecologic issues.   Current Outpatient Prescriptions on File Prior to Visit  Medication Sig Dispense Refill  . albuterol (PROVENTIL HFA;VENTOLIN HFA) 108 (90 BASE) MCG/ACT inhaler Inhale 2 puffs into the lungs every 6 (six) hours as needed.    .  empagliflozin (JARDIANCE) 10 MG TABS tablet Take 10 mg by mouth daily.    . Ginkgo Biloba (GINKOBA PO) Take by mouth daily.    . medroxyPROGESTERone (PROVERA) 5 MG tablet Take 1 tablet (5 mg total) by mouth daily. 30 tablet 1  . Multiple Vitamin (MULTIVITAMIN) tablet Take 1 tablet by mouth daily.    . phentermine 37.5 MG capsule Take 37.5 mg by mouth daily.     No current facility-administered medications on file prior to visit.   No Known Allergies  Social History:  reports that she has quit smoking. Her smoking use included Cigarettes. She has a 15 pack-year smoking history. She has never used smokeless tobacco. She reports that she drinks alcohol. She reports that she does not use illicit drugs.  Family History  Problem Relation Age of Onset  . Diabetes Mother   . Ovarian cancer Mother   . Colon cancer Neg Hx     Review of Systems: Noncontributory  PHYSICAL EXAM: Blood pressure 120/80, height 5' 1.25" (1.556 m), weight 162 lb 8 oz (73.71 kg), last menstrual period 04/18/2015. General appearance - alert, well appearing, and in no distress Physical Examination: General appearance - alert, well appearing, and in no distress and oriented to person, place, and time Mental status - alert, oriented to person, place, and time, normal mood, behavior, speech, dress, motor activity, and thought processes Chest - clear to auscultation,  no wheezes, rales or rhonchi, symmetric air entry Heart - normal rate, regular rhythm, normal S1, S2, no murmurs, rubs, clicks or gallops Pelvic - normal external genitalia, vulva, vagina, cervix, uterus and adnexa VULVA: normal appearing vulva with no masses, tenderness or lesions  VAGINA: normal appearing vagina with normal color and discharge, no lesions CERVIX: normal appearing cervix without discharge or lesions UTERUS: uterus is normal size, shape, consistency and nontender ADNEXA: normal adnexa in size,  nontender and no masses Extremities - peripheral pulses normal, no pedal edema, no clubbing or cyanosis  Labs: No results found for this or any previous visit (from the past 336 hour(s)).  Imaging Studies:  Imaging Results    US Transvaginal Non-ob  04/25/2015 GYNECOLOGIC SONOGRAM Olivia Church is a 34 y.o. [id number] LMP10/09/2014 for a pelvic sonogram for abnormal uterine bleeding. Uterus  8.4 x 4.7 x 4.0 cm, normal anteverted uterus Endometrium 3.25 mm, symmetrical, wnl Right ovary 3.1 x 1.9 x 1.8 cm, wnl Left ovary 2.6 x 2.3 x 1.5 cm, wnl Technician Comments: PELVIC US TA/TV,normal anteverted uterus,normal ov's bilat (mobile),no free fluid seen,EEC 3.32mm,no pain during ultrasound. Silver Huguenin 04/25/2015 10:17 AM   US Pelvis Complete  04/25/2015 GYNECOLOGIC SONOGRAM Olivia Church is a 34 y.o. [id number] LMP10/09/2014 for a pelvic sonogram for abnormal uterine bleeding. Uterus  8.4 x 4.7 x 4.0 cm, normal anteverted uterus Endometrium 3.25 mm, symmetrical, wnl Right ovary 3.1 x 1.9 x 1.8 cm, wnl Left ovary 2.6 x 2.3 x 1.5 cm, wnl Technician Comments: PELVIC US TA/TV,normal anteverted uterus,normal ov's bilat (mobile),no free fluid seen,EEC 3.11mm,no pain during ultrasound. Silver Huguenin 04/25/2015 10:17 AM     Assessment: Patient Active Problem List   Diagnosis Date Noted  . Menorrhagia with irregular cycle 03/14/2015  . Irregular periods 03/04/2015  . Dysmenorrhea 03/04/2015  . Vaginal discharge 03/04/2015  . Acute folliculitis vulva 11/23/209  . Chronic diarrhea 04/16/2012  . Anxiety 04/16/2012  . Depression 04/16/2012  . Asthma 04/16/2012  . DM (diabetes mellitus) ([place]) 04/16/2012  . Chronic UTI 04/16/2012    Plan: Patient will undergo surgical management with endometrial ablation.       Assessment & Plan:   A:  1. Pelvic and transvaginal US showed normal uterus and ovaries, EEC 3.3 mm  P:  1. Will plan for endometrial ablation on 10/14 2. Will treat pt with Diflucan [name], MD

## 2015-04-29 NOTE — Op Note (Signed)
See the brief operative note for surgical details of procedure

## 2015-05-02 ENCOUNTER — Encounter (HOSPITAL_COMMUNITY): Payer: Self-pay | Admitting: Obstetrics and Gynecology

## 2015-05-02 ENCOUNTER — Encounter: Payer: Self-pay | Admitting: Obstetrics and Gynecology

## 2015-05-09 ENCOUNTER — Telehealth: Payer: Self-pay | Admitting: Obstetrics and Gynecology

## 2015-05-09 ENCOUNTER — Encounter: Payer: BLUE CROSS/BLUE SHIELD | Admitting: Obstetrics and Gynecology

## 2015-05-09 ENCOUNTER — Telehealth: Payer: Self-pay | Admitting: *Deleted

## 2015-05-09 MED ORDER — FLUCONAZOLE 150 MG PO TABS: 150.0000 mg | ORAL_TABLET | Freq: Once | ORAL | Status: DC

## 2015-05-09 NOTE — Telephone Encounter (Signed)
Duplicate message. 

## 2015-05-16 ENCOUNTER — Ambulatory Visit (INDEPENDENT_AMBULATORY_CARE_PROVIDER_SITE_OTHER): Payer: BLUE CROSS/BLUE SHIELD | Admitting: Obstetrics and Gynecology

## 2015-05-16 ENCOUNTER — Encounter: Payer: Self-pay | Admitting: Obstetrics and Gynecology

## 2015-05-16 VITALS — BP 120/64 | Ht 61.25 in | Wt 161.0 lb

## 2015-05-16 DIAGNOSIS — N920 Excessive and frequent menstruation with regular cycle: Secondary | ICD-10-CM

## 2015-05-16 DIAGNOSIS — Z9889 Other specified postprocedural states: Secondary | ICD-10-CM | POA: Diagnosis not present

## 2015-05-16 NOTE — Progress Notes (Signed)
Patient ID: Olivia Church, female   DOB: [birth year], 34 y.o.   MRN: [id number] Pt here today for post op visit. Pt denies any problems or concerns at this time.

## 2015-05-16 NOTE — Patient Instructions (Signed)
Discussed dyspareunia

## 2015-05-16 NOTE — Progress Notes (Signed)
Patient ID: Olivia Church, female   DOB: 04/07/1981, 34 y.o.   MRN: [id number]    Subjective:  Olivia Church is a 34 y.o. female now 2 weeks status post operative hysteroscopy. With endo ablation   pt is sexually active, with some deep discomfort from uteruine contact Review of Systems Negative except lite d/c   Diet:   regular   Bowel movements : normal.  The patient is not having any pain.  Objective:  BP 120/64 mmHg  Ht 5' 1.25" (1.556 m)  Wt 161 lb (73.029 kg)  BMI 30.16 kg/m2  LMP 04/18/2015 General:Well developed, well nourished.  No acute distress. Abdomen: Bowel sounds normal, soft, non-tender. Pelvic Exam:    External Genitalia:  Normal.    Vagina: Normal short vag length.     Cervix: Normal    Uterus: Normal anteflexed,    Adnexa/Bimanual: Normal nontender uterus relatively immobile     Healing well, no drainage, no erythema, no hernia, no swelling, no dehiscence,     Assessment:  Post-Op 2 weeks s/p operative hysteroscopy and and a blation    Doing well postoperatively.   Plan:  1.gyn care discussed   2. . current medications.no change 3. Activity restrictions: none 4. return to work: not applicable. 5. Follow up in prn .

## 2015-05-23 ENCOUNTER — Telehealth: Payer: Self-pay | Admitting: Obstetrics and Gynecology

## 2015-05-23 NOTE — Telephone Encounter (Signed)
Pt states that she needs a refill on her diflucan. Pt keeps getting a yeast infection. Pt unsure what's going on. Pt advised that I would discuss this with Dr.Ferguson and get back with her about above. Pt verbalized understanding.

## 2015-05-23 NOTE — Telephone Encounter (Signed)
Per Dr. Glo Herring give Hylafem to see if this helps.

## 2015-05-25 NOTE — Telephone Encounter (Signed)
Pt aware to stop by office and pick up samples of medication per Dr. Glo Herring.

## 2015-06-02 ENCOUNTER — Telehealth: Payer: Self-pay | Admitting: Obstetrics and Gynecology

## 2015-06-03 ENCOUNTER — Encounter: Payer: Self-pay | Admitting: Obstetrics & Gynecology

## 2015-06-03 ENCOUNTER — Ambulatory Visit (INDEPENDENT_AMBULATORY_CARE_PROVIDER_SITE_OTHER): Payer: BLUE CROSS/BLUE SHIELD | Admitting: Obstetrics & Gynecology

## 2015-06-03 VITALS — BP 110/76 | HR 90 | Ht 62.25 in | Wt 161.0 lb

## 2015-06-03 DIAGNOSIS — N72 Inflammatory disease of cervix uteri: Secondary | ICD-10-CM | POA: Diagnosis not present

## 2015-06-03 DIAGNOSIS — N760 Acute vaginitis: Secondary | ICD-10-CM

## 2015-06-03 DIAGNOSIS — N9089 Other specified noninflammatory disorders of vulva and perineum: Secondary | ICD-10-CM

## 2015-06-03 MED ORDER — METRONIDAZOLE 500 MG PO TABS: 500.0000 mg | ORAL_TABLET | Freq: Two times a day (BID) | ORAL | Status: DC

## 2015-06-03 NOTE — Telephone Encounter (Signed)
Pt states she is on her way to the office to discuss medication.

## 2015-06-03 NOTE — Progress Notes (Signed)
Patient ID: Olivia Church, female   DOB: 07-31-1980, 34 y.o.   MRN: [id number]      Chief Complaint  Patient presents with  . Vaginal Discharge and irritaton    Blood pressure 110/76, pulse 90, height 5' 2.25" (1.581 m), weight 161 lb (73.029 kg).  34 y.o. [id number] No LMP recorded. Patient has had an ablation. The current method of family planning is tubal ligation 2002  Subjective Post op ablation 04/29/2015 Since then has had an irritation and bothersome discharge No odor Has used diflucan x2 and hylafem samples x 2  Objective Vulva:  Irritated erythematous, probably allergic/irritation from chronically using pads v yeast vs both, topical gentian violet Vagina:  normal mucosa, thin grey discharge Cervix:  no cervical motion tenderness and no lesions Uterus:  normal size, contour, position, consistency, mobility, non-tender and uterus is not tender Adnexa: ovaries:,    Wet Prep:   A sample of vaginal discharge was obtained from the posterior fornix using a cotton swab. 2 drops of saline were placed on a slide and the cotton swab was immersed in the saline. Microscopic evaluation was performed and results were as follows:  Negative  for yeast  Negative for clue cells , consistent with Bacterial vaginosis Negative for trichomonas  Abnormal- very heavy wbc infiltrate WBC population   Whiff test: Negative     Pertinent ROS As above  Labs or studies Wet prep    Impression Diagnoses this Encounter::   ICD-9-CM ICD-10-CM   1. Vaginitis 616.10 N76.0   2. Cervicitis 616.0 N72   3. Vulvar irritation 624.8 N90.89     Established relevant diagnosis(es): Post op ablation  Plan/Recommendations: Meds ordered this encounter  Medications  . CHANTIX STARTING MONTH PAK 0.5 MG X 11 & 1 MG X 42 tablet    Sig: Take 1 mg by mouth 2 (two) times daily.   . metroNIDAZOLE (FLAGYL) 500 MG tablet    Sig: Take 1 tablet (500 mg total) by mouth 2 (two) times daily.    Dispense:   14 tablet    Refill:  0    Labs or Scans Ordered: No orders of the defined types were placed in this encounter.    Use desitin as long as uses pads Pt aware that the heavy WBC most likely a result of the ablation post op sloughing and subsequent healing Her vaginal microenvironment is stable, however the flagyl could disrupt it and then cause a yeast vaginitis If it does will need re evaluation  Follow up prn    All questions were answered.

## 2015-06-03 NOTE — Telephone Encounter (Signed)
Pt walked into the office, continues to c/o yeast infection after endometrial ablation x 1 mos ago.

## 2015-07-23 ENCOUNTER — Encounter (HOSPITAL_COMMUNITY): Payer: Self-pay

## 2015-07-23 ENCOUNTER — Emergency Department (HOSPITAL_COMMUNITY)
Admission: EM | Admit: 2015-07-23 | Discharge: 2015-07-23 | Disposition: A | Discharge status: Home / Self Care | Payer: Managed Care, Other (non HMO) | Attending: Emergency Medicine | Admitting: Emergency Medicine

## 2015-07-23 DIAGNOSIS — Z87440 Personal history of urinary (tract) infections: Secondary | ICD-10-CM | POA: Diagnosis not present

## 2015-07-23 DIAGNOSIS — Z87891 Personal history of nicotine dependence: Secondary | ICD-10-CM | POA: Insufficient documentation

## 2015-07-23 DIAGNOSIS — Z8742 Personal history of other diseases of the female genital tract: Secondary | ICD-10-CM | POA: Diagnosis not present

## 2015-07-23 DIAGNOSIS — J3489 Other specified disorders of nose and nasal sinuses: Secondary | ICD-10-CM | POA: Diagnosis not present

## 2015-07-23 DIAGNOSIS — R51 Headache: Secondary | ICD-10-CM | POA: Insufficient documentation

## 2015-07-23 DIAGNOSIS — Z8739 Personal history of other diseases of the musculoskeletal system and connective tissue: Secondary | ICD-10-CM | POA: Insufficient documentation

## 2015-07-23 DIAGNOSIS — Y9289 Other specified places as the place of occurrence of the external cause: Secondary | ICD-10-CM | POA: Insufficient documentation

## 2015-07-23 DIAGNOSIS — J45909 Unspecified asthma, uncomplicated: Secondary | ICD-10-CM | POA: Diagnosis not present

## 2015-07-23 DIAGNOSIS — Y998 Other external cause status: Secondary | ICD-10-CM | POA: Diagnosis not present

## 2015-07-23 DIAGNOSIS — S00412A Abrasion of left ear, initial encounter: Secondary | ICD-10-CM | POA: Diagnosis not present

## 2015-07-23 DIAGNOSIS — S00411A Abrasion of right ear, initial encounter: Secondary | ICD-10-CM | POA: Insufficient documentation

## 2015-07-23 DIAGNOSIS — H9203 Otalgia, bilateral: Secondary | ICD-10-CM | POA: Insufficient documentation

## 2015-07-23 DIAGNOSIS — E119 Type 2 diabetes mellitus without complications: Secondary | ICD-10-CM | POA: Insufficient documentation

## 2015-07-23 DIAGNOSIS — F419 Anxiety disorder, unspecified: Secondary | ICD-10-CM | POA: Insufficient documentation

## 2015-07-23 DIAGNOSIS — Z792 Long term (current) use of antibiotics: Secondary | ICD-10-CM | POA: Insufficient documentation

## 2015-07-23 DIAGNOSIS — Y9389 Activity, other specified: Secondary | ICD-10-CM | POA: Diagnosis not present

## 2015-07-23 DIAGNOSIS — Z79899 Other long term (current) drug therapy: Secondary | ICD-10-CM | POA: Diagnosis not present

## 2015-07-23 DIAGNOSIS — X58XXXA Exposure to other specified factors, initial encounter: Secondary | ICD-10-CM | POA: Insufficient documentation

## 2015-07-23 MED ORDER — OXYMETAZOLINE HCL 0.05 % NA SOLN
1.0000 | Freq: Once | NASAL | Status: AC
  Administered 2015-07-23: 1 via NASAL
  Filled 2015-07-23: qty 15

## 2015-07-23 MED ORDER — AMOXICILLIN 500 MG PO CAPS: 500.0000 mg | ORAL_CAPSULE | Freq: Three times a day (TID) | ORAL | Status: DC

## 2015-07-23 MED ORDER — ACETAMINOPHEN-CODEINE #3 300-30 MG PO TABS
2.0000 | ORAL_TABLET | Freq: Once | ORAL | Status: AC
  Administered 2015-07-23: 2 via ORAL
  Filled 2015-07-23: qty 2

## 2015-07-23 MED ORDER — PROMETHAZINE HCL 12.5 MG PO TABS
12.5000 mg | ORAL_TABLET | Freq: Once | ORAL | Status: AC
  Administered 2015-07-23: 12.5 mg via ORAL
  Filled 2015-07-23: qty 1

## 2015-07-23 MED ORDER — DEXAMETHASONE SODIUM PHOSPHATE 4 MG/ML IJ SOLN
8.0000 mg | Freq: Once | INTRAMUSCULAR | Status: AC
  Administered 2015-07-23: 8 mg via INTRAMUSCULAR
  Filled 2015-07-23: qty 2

## 2015-07-23 MED ORDER — AMOXICILLIN 250 MG PO CAPS
500.0000 mg | ORAL_CAPSULE | Freq: Once | ORAL | Status: AC
  Administered 2015-07-23: 500 mg via ORAL
  Filled 2015-07-23: qty 2

## 2015-07-23 MED ORDER — ACETAMINOPHEN-CODEINE #3 300-30 MG PO TABS
1.0000 | ORAL_TABLET | Freq: Four times a day (QID) | ORAL | Status: DC | PRN
Start: 2015-07-23 — End: 2021-03-16

## 2015-07-23 MED ORDER — NEOMYCIN-POLYMYXIN-HC 3.5-10000-1 OT SUSP: 4.0000 [drp] | Freq: Three times a day (TID) | OTIC | Status: DC

## 2015-07-23 NOTE — ED Provider Notes (Signed)
CSN: [id number]     Arrival date & time 07/23/15  1419 History   None    Chief Complaint  Olivia Church presents with  . Otalgia     (Consider location/radiation/quality/duration/timing/severity/associated sxs/prior Treatment) Olivia Church is a 35 y.o. female presenting with ear pain. The history is provided by the Olivia Church.  Otalgia Location:  Bilateral Behind ear:  No abnormality Quality:  Aching and throbbing Severity:  Moderate Onset quality:  Gradual Duration:  4 days Timing:  Intermittent Progression:  Worsening Chronicity:  New Context comment:  Upper respiratory symptoms. Relieved by:  Nothing Worsened by:  Nothing tried Associated symptoms: congestion, headaches and rhinorrhea   Associated symptoms: no ear discharge, no fever and no rash     Past Medical History  Diagnosis Date  . Anxiety   . Asthma   . Depression   . Diabetes mellitus ([employer])     diet controlled  . Chronic UTI (urinary tract infection)   . Bursitis   . Irregular periods 03/04/2015  . Dysmenorrhea 03/04/2015  . Vaginal discharge 03/04/2015  . Bursitis     right hip   Past Surgical History  Procedure Laterality Date  . Tubal ligation  01-04-01  . Dilitation & currettage/hystroscopy with novasure ablation N/A 04/29/2015    Procedure: DILATATION & CURETTAGE/HYSTEROSCOPY WITH NOVASURE ABLATION;  Surgeon: [name], MD;  Location: [facility];  Service: Gynecology;  Laterality: N/A;   Family History  Problem Relation Age of Onset  . Diabetes Mother   . Ovarian cancer Mother   . Colon cancer Neg Hx    Social History  Substance Use Topics  . Smoking status: Former Smoker -- 0.50 packs/day for 15 years    Types: Cigarettes  . Smokeless tobacco: Never Used     Comment: tobacco info given 04/16/12  . Alcohol Use: Yes     Comment: rare   OB History    Gravida Para Term Preterm AB TAB SAB Ectopic Multiple Living   4 3   1 1    3      Review of Systems  Constitutional: Negative for fever.  HENT:  Positive for congestion, ear pain, postnasal drip and rhinorrhea. Negative for ear discharge.   Skin: Negative for rash.  Neurological: Positive for headaches.  Psychiatric/Behavioral: The Olivia Church is nervous/anxious.   All other systems reviewed and are negative.     Allergies  Review of Olivia Church's allergies indicates no known allergies.  Home Medications   Prior to Admission medications   Medication Sig Start Date End Date Taking? Authorizing Provider  albuterol (PROVENTIL HFA;VENTOLIN HFA) 108 (90 BASE) MCG/ACT inhaler Inhale 2 puffs into the lungs every 6 (six) hours as needed.    Historical Provider, MD  CHANTIX STARTING MONTH PAK 0.5 MG X 11 & 1 MG X 42 tablet Take 1 mg by mouth 2 (two) times daily.  05/16/15   Historical Provider, MD  empagliflozin (JARDIANCE) 10 MG TABS tablet Take 10 mg by mouth daily.    Historical Provider, MD  FLUoxetine HCl 60 MG TABS Take 1 tablet by mouth daily. 03/31/15   Historical Provider, MD  metroNIDAZOLE (FLAGYL) 500 MG tablet Take 1 tablet (500 mg total) by mouth 2 (two) times daily. 06/03/15   Florian Buff, MD  phentermine 37.5 MG capsule Take 37.5 mg by mouth daily.    Historical Provider, MD   There were no vitals taken for this visit. Physical Exam  Constitutional: She is oriented to person, place, and time. She appears well-developed  and well-nourished.  Non-toxic appearance.  HENT:  Head: Normocephalic.  Right Ear: Tympanic membrane and external ear normal.  Left Ear: Tympanic membrane and external ear normal.  There are some abrasions along the external auditory canal bilaterally. There is fluid behind the drums with mild bulging. There no mastoid involvement.  Eyes: EOM and lids are normal. Pupils are equal, round, and reactive to light.  Neck: Normal range of motion. Neck supple. Carotid bruit is not present.  Cardiovascular: Normal rate, regular rhythm, normal heart sounds, intact distal pulses and normal pulses.   Pulmonary/Chest:  Breath sounds normal. No respiratory distress.  Abdominal: Soft. Bowel sounds are normal. There is no tenderness. There is no guarding.  Musculoskeletal: Normal range of motion.  Lymphadenopathy:       Head (right side): No submandibular adenopathy present.       Head (left side): No submandibular adenopathy present.    She has no cervical adenopathy.  Neurological: She is alert and oriented to person, place, and time. She has normal strength. No cranial nerve deficit or sensory deficit.  Skin: Skin is warm and dry.  Psychiatric: She has a normal mood and affect. Her speech is normal.  Nursing note and vitals reviewed.   ED Course  Procedures (including critical care time) Labs Review Labs Reviewed - No data to display  Imaging Review No results found. I have personally reviewed and evaluated these images and lab results as part of my medical decision-making.   EKG Interpretation None      MDM  Vital signs stable. Pt will be treated for ear infection with tylenol codeine, amoxil, and cortisporin. Pt to follow up with ENT if not improving.   Final diagnoses:  Otalgia, bilateral    **I have reviewed nursing notes, vital signs, and all appropriate lab and imaging results for this Olivia Church.Lily Kocher, PA-C 07/25/15 [place], MD 07/25/15 [phone]

## 2015-07-23 NOTE — ED Notes (Addendum)
Pt c/o severe bilateral ear pressure for the past few days, worse today.

## 2015-07-23 NOTE — Discharge Instructions (Signed)
It is important that you increase fluids. Use Tylenol or ibuprofen for fever or mild aching. Use Tylenol codeine for more severe pain. This medication may cause drowsiness, please use with caution. Please use Amoxil 3 times daily with food. Please use 4 drops of crisp oriented to both ears 3 times daily over the next 5-7 days. It is important that she keep Q-tips and other objects out of your ears to prevent injury. Use Afrin to Earache An earache, also called otalgia, can be caused by many things. Pain from an earache can be sharp, dull, or burning. The pain may be temporary or constant. Earaches can be caused by problems with the ear, such as infection in either the middle ear or the ear canal, injury, impacted ear wax, middle ear pressure, or a foreign body in the ear. Ear pain can also result from problems in other areas. This is called referred pain. For example, pain can come from a sore throat, a tooth infection, or problems with the jaw or the joint between the jaw and the skull (temporomandibular joint, or TMJ). The cause of an earache is not always easy to identify. Watchful waiting may be appropriate for some earaches until a clear cause of the pain can be found. HOME CARE INSTRUCTIONS Watch your condition for any changes. The following actions may help to lessen any discomfort that you are feeling:  Take medicines only as directed by your health care provider. This includes ear drops.  Apply ice to your outer ear to help reduce pain.  Put ice in a plastic bag.  Place a towel between your skin and the bag.  Leave the ice on for 20 minutes, 2-3 times per day.  Do not put anything in your ear other than medicine that is prescribed by your health care provider.  Try resting in an upright position instead of lying down. This may help to reduce pressure in the middle ear and relieve pain.  Chew gum if it helps to relieve your ear pain.  Control any allergies that you have.  Keep all  follow-up visits as directed by your health care provider. This is important. SEEK MEDICAL CARE IF:  Your pain does not improve within 2 days.  You have a fever.  You have new or worsening symptoms. SEEK IMMEDIATE MEDICAL CARE IF:  You have a severe headache.  You have a stiff neck.  You have difficulty swallowing.  You have redness or swelling behind your ear.  You have drainage from your ear.  You have hearing loss.  You feel dizzy.   This information is not intended to replace advice given to you by your health care provider. Make sure you discuss any questions you have with your health care provider.   Document Released: 02/17/2004 Document Revised: 07/23/2014 Document Reviewed: 01/31/2014 Elsevier Interactive Patient Education 2016 [employer].  both nostrils every 8 hours for 5 days only.

## 2015-07-31 ENCOUNTER — Encounter (HOSPITAL_COMMUNITY): Payer: Self-pay | Admitting: Emergency Medicine

## 2015-07-31 ENCOUNTER — Emergency Department (HOSPITAL_COMMUNITY)
Admission: EM | Admit: 2015-07-31 | Discharge: 2015-07-31 | Disposition: A | Discharge status: Home / Self Care | Payer: Managed Care, Other (non HMO) | Attending: Emergency Medicine | Admitting: Emergency Medicine

## 2015-07-31 DIAGNOSIS — J45909 Unspecified asthma, uncomplicated: Secondary | ICD-10-CM | POA: Diagnosis not present

## 2015-07-31 DIAGNOSIS — Z8742 Personal history of other diseases of the female genital tract: Secondary | ICD-10-CM | POA: Insufficient documentation

## 2015-07-31 DIAGNOSIS — E119 Type 2 diabetes mellitus without complications: Secondary | ICD-10-CM | POA: Insufficient documentation

## 2015-07-31 DIAGNOSIS — Z792 Long term (current) use of antibiotics: Secondary | ICD-10-CM | POA: Insufficient documentation

## 2015-07-31 DIAGNOSIS — F419 Anxiety disorder, unspecified: Secondary | ICD-10-CM | POA: Diagnosis not present

## 2015-07-31 DIAGNOSIS — Z79899 Other long term (current) drug therapy: Secondary | ICD-10-CM | POA: Diagnosis not present

## 2015-07-31 DIAGNOSIS — H66006 Acute suppurative otitis media without spontaneous rupture of ear drum, recurrent, bilateral: Secondary | ICD-10-CM

## 2015-07-31 DIAGNOSIS — Z87891 Personal history of nicotine dependence: Secondary | ICD-10-CM | POA: Diagnosis not present

## 2015-07-31 DIAGNOSIS — H9203 Otalgia, bilateral: Secondary | ICD-10-CM | POA: Diagnosis present

## 2015-07-31 DIAGNOSIS — Z87440 Personal history of urinary (tract) infections: Secondary | ICD-10-CM | POA: Diagnosis not present

## 2015-07-31 DIAGNOSIS — F329 Major depressive disorder, single episode, unspecified: Secondary | ICD-10-CM | POA: Diagnosis not present

## 2015-07-31 DIAGNOSIS — Z8739 Personal history of other diseases of the musculoskeletal system and connective tissue: Secondary | ICD-10-CM | POA: Insufficient documentation

## 2015-07-31 MED ORDER — CEFDINIR 300 MG PO CAPS: 300.0000 mg | ORAL_CAPSULE | Freq: Two times a day (BID) | ORAL | Status: DC

## 2015-07-31 MED ORDER — HYDROCODONE-ACETAMINOPHEN 5-325 MG PO TABS: 2.0000 | ORAL_TABLET | ORAL | Status: DC | PRN

## 2015-07-31 NOTE — ED Provider Notes (Signed)
CSN: [id number]     Arrival date & time 07/31/15  1137 History  By signing my name below, I, Starleen Arms, attest that this documentation has been prepared under the direction and in the presence of Marcene Brawn, PA-C. Electronically Signed: Starleen Arms ED Scribe. 07/31/2015. 11:54 AM.    Chief Complaint  Patient presents with  . Ear Fullness   The history is provided by the patient. No language interpreter was used.   HPI Comments: Olivia Church is a 35 y.o. female who presents to the Emergency Department complaining of constant, gradual onset bilateral ear pain which extends to the right lateral face onset 2 weeks ago.  Associated symptoms include ear fullness and muffled hearing out the right ear.  Patient was seen in the ED for the same 1 week ago where she was dx'd with a bilateral ear infection and rx'd amoxicillin, steroid injection, and steroid ear drops.  She has been compliant with medications and has completed her antibiotic.  She denies hx of chronic ear infection.  She denies fever, chills.   Past Medical History  Diagnosis Date  . Anxiety   . Asthma   . Depression   . Diabetes mellitus ([place])     diet controlled  . Chronic UTI (urinary tract infection)   . Bursitis   . Irregular periods 03/04/2015  . Dysmenorrhea 03/04/2015  . Vaginal discharge 03/04/2015  . Bursitis     right hip   Past Surgical History  Procedure Laterality Date  . Tubal ligation  01-04-01  . Dilitation & currettage/hystroscopy with novasure ablation N/A 04/29/2015    Procedure: DILATATION & CURETTAGE/HYSTEROSCOPY WITH NOVASURE ABLATION;  Surgeon: [name], MD;  Location: [facility];  Service: Gynecology;  Laterality: N/A;   Family History  Problem Relation Age of Onset  . Diabetes Mother   . Ovarian cancer Mother   . Colon cancer Neg Hx    Social History  Substance Use Topics  . Smoking status: Former Smoker -- 0.50 packs/day for 15 years    Types: Cigarettes    Quit date: 06/01/2015  .  Smokeless tobacco: Never Used     Comment: tobacco info given 04/16/12  . Alcohol Use: Yes     Comment: rare   OB History    Gravida Para Term Preterm AB TAB SAB Ectopic Multiple Living   4 3   1 1    3      Review of Systems A complete 10 system review of systems was obtained and all systems are negative except as noted in the HPI and PMH.   Allergies  Review of patient's allergies indicates no known allergies.  Home Medications   Prior to Admission medications   Medication Sig Start Date End Date Taking? Authorizing Provider  acetaminophen-codeine (TYLENOL #3) 300-30 MG tablet Take 1-2 tablets by mouth every 6 (six) hours as needed for moderate pain. 07/23/15   Lily Kocher, PA-C  albuterol (PROVENTIL HFA;VENTOLIN HFA) 108 (90 BASE) MCG/ACT inhaler Inhale 1-2 puffs into the lungs every 6 (six) hours as needed for wheezing or shortness of breath.     Historical Provider, MD  amoxicillin (AMOXIL) 500 MG capsule Take 1 capsule (500 mg total) by mouth 3 (three) times daily. 07/23/15   Lily Kocher, PA-C  CHANTIX STARTING MONTH PAK 0.5 MG X 11 & 1 MG X 42 tablet Take 1 mg by mouth 2 (two) times daily.  05/16/15   Historical Provider, MD  diphenhydrAMINE (BENADRYL) 25 MG tablet Take 25  mg by mouth every 6 (six) hours as needed for itching or allergies.    Historical Provider, MD  diphenhydramine-acetaminophen (TYLENOL PM) 25-500 MG TABS tablet Take 1 tablet by mouth at bedtime as needed (pain/sleep).    Historical Provider, MD  FLUoxetine (PROZAC) 40 MG capsule Take 40 mg by mouth daily. 07/16/15   Historical Provider, MD  HYDROMET 5-1.5 MG/5ML syrup Take 5 mLs by mouth every 6 (six) hours as needed for cough.  07/21/15   Historical Provider, MD  JARDIANCE 25 MG TABS tablet Take 25 mg by mouth daily. 05/31/15   Historical Provider, MD  metroNIDAZOLE (FLAGYL) 500 MG tablet Take 1 tablet (500 mg total) by mouth 2 (two) times daily. Patient not taking: Reported on 07/23/2015 06/03/15   Florian Buff,  MD  neomycin-polymyxin-hydrocortisone (CORTISPORIN) 3.5-10000-1 otic suspension Place 4 drops into both ears 3 (three) times daily. 07/23/15   Lily Kocher, PA-C   BP 130/79 mmHg  Pulse 87  Temp(Src) 97.9 F (36.6 C) (Oral)  Resp 16  Ht 5\' 2"  (1.575 m)  Wt 169 lb (76.658 kg)  BMI 30.90 kg/m2  SpO2 96% Physical Exam  Constitutional: She is oriented to person, place, and time. She appears well-developed and well-nourished. No distress.  HENT:  Head: Normocephalic and atraumatic.  Right TM: erythematous streak right TM.  Left TM: diffusely erythematous and bulging.  Eyes: Conjunctivae and EOM are normal.  Neck: Neck supple. No tracheal deviation present.  Cardiovascular: Normal rate.   Pulmonary/Chest: Effort normal. No respiratory distress.  Musculoskeletal: Normal range of motion.  Neurological: She is alert and oriented to person, place, and time.  Skin: Skin is warm and dry.  Psychiatric: She has a normal mood and affect. Her behavior is normal.  Nursing note and vitals reviewed.   ED Course  Procedures (including critical care time)  DIAGNOSTIC STUDIES: Oxygen Saturation is 96% on RA, normal by my interpretation.    COORDINATION OF CARE:  S12:55 PM Discuss plan to prescribe different antibiotic and provide referral to ENT.  Patient acknowledges and agrees with plan.    Labs Review Labs Reviewed - No data to display  Imaging Review No results found. I have personally reviewed and evaluated these images and lab results as part of my medical decision-making.   EKG Interpretation None      MDM Schedule to see Dr. Benjamine Mola for evaluation   Final diagnoses:  Recurrent acute suppurative otitis media without spontaneous rupture of tympanic membrane of both sides    Meds ordered this encounter  Medications  . cefdinir (OMNICEF) 300 MG capsule    Sig: Take 1 capsule (300 mg total) by mouth 2 (two) times daily.    Dispense:  20 capsule    Refill:  0    Order Specific  Question:  Supervising Provider    Answer:  MILLER, BRIAN [[id number]]  . HYDROcodone-acetaminophen (NORCO/VICODIN) 5-325 MG tablet    Sig: Take 2 tablets by mouth every 4 (four) hours as needed.    Dispense:  20 tablet    Refill:  0    Order Specific Question:  Supervising Provider    Answer:  Noemi Chapel [place], PA-C 07/31/15 [place], MD 07/31/15 1422

## 2015-07-31 NOTE — ED Notes (Signed)
Patient c/o bilateral ear pain with ear fullness. Per patient seen here last week for same and given antibiotics and steroid otic drops with no relief. Denies any drainage or fevers.

## 2015-07-31 NOTE — Discharge Instructions (Signed)

## 2015-09-08 ENCOUNTER — Telehealth: Payer: Self-pay | Admitting: Obstetrics and Gynecology

## 2015-09-08 NOTE — Telephone Encounter (Signed)
Pt aware that she needs to make an appointment to be seen. Pt states that she was getting ready to walk out the door for work and would call back in the morning and make an appointment.

## 2016-05-24 ENCOUNTER — Other Ambulatory Visit: Payer: Self-pay | Admitting: Obstetrics and Gynecology

## 2016-05-29 ENCOUNTER — Other Ambulatory Visit: Payer: Self-pay | Admitting: *Deleted

## 2016-05-29 MED ORDER — FLUCONAZOLE 150 MG PO TABS: ORAL_TABLET | ORAL | 0 refills | Status: DC

## 2016-07-31 ENCOUNTER — Other Ambulatory Visit: Payer: Self-pay | Admitting: Obstetrics and Gynecology

## 2016-09-25 ENCOUNTER — Ambulatory Visit: Payer: Managed Care, Other (non HMO) | Admitting: Cardiology

## 2016-09-25 ENCOUNTER — Encounter: Payer: Self-pay | Admitting: Cardiology

## 2016-09-25 NOTE — Progress Notes (Deleted)
Clinical Summary Olivia Church is a 36 y.o.female seen as new patient.  1. ?Heart check out?   Past Medical History:  Diagnosis Date  . Anxiety   . Asthma   . Bursitis   . Bursitis    right hip  . Chronic UTI (urinary tract infection)   . Depression   . Diabetes mellitus ([place])    diet controlled  . Dysmenorrhea 03/04/2015  . Irregular periods 03/04/2015  . Vaginal discharge 03/04/2015     No Known Allergies   Current Outpatient Prescriptions  Medication Sig Dispense Refill  . acetaminophen-codeine (TYLENOL #3) 300-30 MG tablet Take 1-2 tablets by mouth every 6 (six) hours as needed for moderate pain. 15 tablet 0  . albuterol (PROVENTIL HFA;VENTOLIN HFA) 108 (90 BASE) MCG/ACT inhaler Inhale 1-2 puffs into the lungs every 6 (six) hours as needed for wheezing or shortness of breath.     Marland Kitchen amoxicillin (AMOXIL) 500 MG capsule Take 1 capsule (500 mg total) by mouth 3 (three) times daily. 21 capsule 0  . cefdinir (OMNICEF) 300 MG capsule Take 1 capsule (300 mg total) by mouth 2 (two) times daily. 20 capsule 0  . CHANTIX STARTING MONTH PAK 0.5 MG X 11 & 1 MG X 42 tablet Take 1 mg by mouth 2 (two) times daily.     . diphenhydrAMINE (BENADRYL) 25 MG tablet Take 25 mg by mouth every 6 (six) hours as needed for itching or allergies.    . diphenhydramine-acetaminophen (TYLENOL PM) 25-500 MG TABS tablet Take 1 tablet by mouth at bedtime as needed (pain/sleep).    . fluconazole (DIFLUCAN) 150 MG tablet TAKE ONE TABLET BY MOUTH TODAY AND REPEAT IN 3 DAYS IF NEEDED 2 tablet 1  . fluconazole (DIFLUCAN) 150 MG tablet Take one tablet by mouth today and repeat in 3 days if needed 2 tablet 0  . FLUoxetine (PROZAC) 40 MG capsule Take 40 mg by mouth daily.    Marland Kitchen HYDROcodone-acetaminophen (NORCO/VICODIN) 5-325 MG tablet Take 2 tablets by mouth every 4 (four) hours as needed. 20 tablet 0  . HYDROMET 5-1.5 MG/5ML syrup Take 5 mLs by mouth every 6 (six) hours as needed for cough.     Marland Kitchen JARDIANCE 25 MG  TABS tablet Take 25 mg by mouth daily.    . metroNIDAZOLE (FLAGYL) 500 MG tablet Take 1 tablet (500 mg total) by mouth 2 (two) times daily. (Patient not taking: Reported on 07/23/2015) 14 tablet 0  . neomycin-polymyxin-hydrocortisone (CORTISPORIN) 3.5-10000-1 otic suspension Place 4 drops into both ears 3 (three) times daily. 10 mL 0   No current facility-administered medications for this visit.      Past Surgical History:  Procedure Laterality Date  . DILITATION & CURRETTAGE/HYSTROSCOPY WITH NOVASURE ABLATION N/A 04/29/2015   Procedure: DILATATION & CURETTAGE/HYSTEROSCOPY WITH NOVASURE ABLATION;  Surgeon: [name], MD;  Location: [facility];  Service: Gynecology;  Laterality: N/A;  . TUBAL LIGATION  01-04-01     No Known Allergies    Family History  Problem Relation Age of Onset  . Diabetes Mother   . Ovarian cancer Mother   . Colon cancer Neg Hx      Social History Ms. Deupree reports that she quit smoking about 15 months ago. Her smoking use included Cigarettes. She has a 7.50 pack-year smoking history. She has never used smokeless tobacco. Ms. Shanks reports that she drinks alcohol.   Review of Systems CONSTITUTIONAL: No weight loss, fever, chills, weakness or fatigue.  HEENT: Eyes:  No visual loss, blurred vision, double vision or yellow sclerae.No hearing loss, sneezing, congestion, runny nose or sore throat.  SKIN: No rash or itching.  CARDIOVASCULAR:  RESPIRATORY: No shortness of breath, cough or sputum.  GASTROINTESTINAL: No anorexia, nausea, vomiting or diarrhea. No abdominal pain or blood.  GENITOURINARY: No burning on urination, no polyuria NEUROLOGICAL: No headache, dizziness, syncope, paralysis, ataxia, numbness or tingling in the extremities. No change in bowel or bladder control.  MUSCULOSKELETAL: No muscle, back pain, joint pain or stiffness.  LYMPHATICS: No enlarged nodes. No history of splenectomy.  PSYCHIATRIC: No history of depression or anxiety.    ENDOCRINOLOGIC: No reports of sweating, cold or heat intolerance. No polyuria or polydipsia.  Marland Kitchen   Physical Examination There were no vitals filed for this visit. There were no vitals filed for this visit.  Gen: resting comfortably, no acute distress HEENT: no scleral icterus, pupils equal round and reactive, no palptable cervical adenopathy,  CV Resp: Clear to auscultation bilaterally GI: abdomen is soft, non-tender, non-distended, normal bowel sounds, no hepatosplenomegaly MSK: extremities are warm, no edema.  Skin: warm, no rash Neuro:  no focal deficits Psych: appropriate affect   Diagnostic Studies     Assessment and Plan        Arnoldo Lenis, M.D., F.A.C.C.

## 2016-11-15 ENCOUNTER — Ambulatory Visit: Payer: BLUE CROSS/BLUE SHIELD | Admitting: Allergy

## 2017-02-07 ENCOUNTER — Other Ambulatory Visit: Payer: Self-pay | Admitting: Obstetrics and Gynecology

## 2017-04-29 ENCOUNTER — Other Ambulatory Visit: Payer: Self-pay | Admitting: Obstetrics and Gynecology

## 2017-05-16 ENCOUNTER — Ambulatory Visit: Payer: Managed Care, Other (non HMO) | Admitting: Advanced Practice Midwife

## 2017-05-29 ENCOUNTER — Ambulatory Visit: Payer: Managed Care, Other (non HMO) | Admitting: "Endocrinology

## 2017-08-07 ENCOUNTER — Other Ambulatory Visit: Payer: Self-pay | Admitting: Obstetrics and Gynecology

## 2017-11-01 ENCOUNTER — Other Ambulatory Visit: Payer: Self-pay | Admitting: Obstetrics and Gynecology

## 2017-11-27 ENCOUNTER — Other Ambulatory Visit: Payer: Self-pay | Admitting: Obstetrics and Gynecology

## 2018-01-20 ENCOUNTER — Other Ambulatory Visit: Payer: Self-pay | Admitting: Obstetrics and Gynecology

## 2018-07-30 DIAGNOSIS — Z6830 Body mass index (BMI) 30.0-30.9, adult: Secondary | ICD-10-CM | POA: Diagnosis not present

## 2018-07-30 DIAGNOSIS — Z1389 Encounter for screening for other disorder: Secondary | ICD-10-CM | POA: Diagnosis not present

## 2018-07-30 DIAGNOSIS — F320 Major depressive disorder, single episode, mild: Secondary | ICD-10-CM | POA: Diagnosis not present

## 2018-07-30 DIAGNOSIS — E1165 Type 2 diabetes mellitus with hyperglycemia: Secondary | ICD-10-CM | POA: Diagnosis not present

## 2018-07-30 DIAGNOSIS — F909 Attention-deficit hyperactivity disorder, unspecified type: Secondary | ICD-10-CM | POA: Diagnosis not present

## 2018-07-30 DIAGNOSIS — F419 Anxiety disorder, unspecified: Secondary | ICD-10-CM | POA: Diagnosis not present

## 2018-09-24 DIAGNOSIS — F909 Attention-deficit hyperactivity disorder, unspecified type: Secondary | ICD-10-CM | POA: Diagnosis not present

## 2018-09-24 DIAGNOSIS — F419 Anxiety disorder, unspecified: Secondary | ICD-10-CM | POA: Diagnosis not present

## 2018-09-24 DIAGNOSIS — Z1389 Encounter for screening for other disorder: Secondary | ICD-10-CM | POA: Diagnosis not present

## 2018-09-24 DIAGNOSIS — Z6831 Body mass index (BMI) 31.0-31.9, adult: Secondary | ICD-10-CM | POA: Diagnosis not present

## 2018-09-24 DIAGNOSIS — E6609 Other obesity due to excess calories: Secondary | ICD-10-CM | POA: Diagnosis not present

## 2018-10-23 DIAGNOSIS — J22 Unspecified acute lower respiratory infection: Secondary | ICD-10-CM | POA: Diagnosis not present

## 2018-10-23 DIAGNOSIS — R509 Fever, unspecified: Secondary | ICD-10-CM | POA: Diagnosis not present

## 2018-10-23 DIAGNOSIS — E6609 Other obesity due to excess calories: Secondary | ICD-10-CM | POA: Diagnosis not present

## 2018-10-23 DIAGNOSIS — Z6831 Body mass index (BMI) 31.0-31.9, adult: Secondary | ICD-10-CM | POA: Diagnosis not present

## 2018-10-23 DIAGNOSIS — Z1389 Encounter for screening for other disorder: Secondary | ICD-10-CM | POA: Diagnosis not present

## 2018-12-18 DIAGNOSIS — G8929 Other chronic pain: Secondary | ICD-10-CM | POA: Diagnosis not present

## 2018-12-18 DIAGNOSIS — M7542 Impingement syndrome of left shoulder: Secondary | ICD-10-CM | POA: Diagnosis not present

## 2018-12-18 DIAGNOSIS — M25512 Pain in left shoulder: Secondary | ICD-10-CM | POA: Diagnosis not present

## 2018-12-23 DIAGNOSIS — F909 Attention-deficit hyperactivity disorder, unspecified type: Secondary | ICD-10-CM | POA: Diagnosis not present

## 2018-12-23 DIAGNOSIS — F419 Anxiety disorder, unspecified: Secondary | ICD-10-CM | POA: Diagnosis not present

## 2018-12-23 DIAGNOSIS — E669 Obesity, unspecified: Secondary | ICD-10-CM | POA: Diagnosis not present

## 2018-12-29 DIAGNOSIS — E669 Obesity, unspecified: Secondary | ICD-10-CM | POA: Diagnosis not present

## 2018-12-29 DIAGNOSIS — R635 Abnormal weight gain: Secondary | ICD-10-CM | POA: Diagnosis not present

## 2019-01-02 DIAGNOSIS — G8929 Other chronic pain: Secondary | ICD-10-CM | POA: Diagnosis not present

## 2019-01-02 DIAGNOSIS — M67814 Other specified disorders of tendon, left shoulder: Secondary | ICD-10-CM | POA: Diagnosis not present

## 2019-01-02 DIAGNOSIS — M7542 Impingement syndrome of left shoulder: Secondary | ICD-10-CM | POA: Diagnosis not present

## 2019-01-02 DIAGNOSIS — M25512 Pain in left shoulder: Secondary | ICD-10-CM | POA: Diagnosis not present

## 2019-01-02 DIAGNOSIS — M7582 Other shoulder lesions, left shoulder: Secondary | ICD-10-CM | POA: Diagnosis not present

## 2019-01-08 DIAGNOSIS — M7502 Adhesive capsulitis of left shoulder: Secondary | ICD-10-CM | POA: Diagnosis not present

## 2019-02-25 DIAGNOSIS — Z6832 Body mass index (BMI) 32.0-32.9, adult: Secondary | ICD-10-CM | POA: Diagnosis not present

## 2019-02-25 DIAGNOSIS — E6609 Other obesity due to excess calories: Secondary | ICD-10-CM | POA: Diagnosis not present

## 2019-02-25 DIAGNOSIS — F909 Attention-deficit hyperactivity disorder, unspecified type: Secondary | ICD-10-CM | POA: Diagnosis not present

## 2019-02-25 DIAGNOSIS — E118 Type 2 diabetes mellitus with unspecified complications: Secondary | ICD-10-CM | POA: Diagnosis not present

## 2019-02-26 DIAGNOSIS — G4709 Other insomnia: Secondary | ICD-10-CM | POA: Diagnosis not present

## 2019-03-24 DIAGNOSIS — F909 Attention-deficit hyperactivity disorder, unspecified type: Secondary | ICD-10-CM | POA: Diagnosis not present

## 2019-03-24 DIAGNOSIS — F419 Anxiety disorder, unspecified: Secondary | ICD-10-CM | POA: Diagnosis not present

## 2019-04-01 DIAGNOSIS — Z6829 Body mass index (BMI) 29.0-29.9, adult: Secondary | ICD-10-CM | POA: Diagnosis not present

## 2019-04-01 DIAGNOSIS — S46212A Strain of muscle, fascia and tendon of other parts of biceps, left arm, initial encounter: Secondary | ICD-10-CM | POA: Diagnosis not present

## 2019-04-01 DIAGNOSIS — M7502 Adhesive capsulitis of left shoulder: Secondary | ICD-10-CM | POA: Diagnosis not present

## 2019-04-13 DIAGNOSIS — Z6829 Body mass index (BMI) 29.0-29.9, adult: Secondary | ICD-10-CM | POA: Diagnosis not present

## 2019-04-13 DIAGNOSIS — M7502 Adhesive capsulitis of left shoulder: Secondary | ICD-10-CM | POA: Diagnosis not present

## 2019-04-17 DIAGNOSIS — Z01818 Encounter for other preprocedural examination: Secondary | ICD-10-CM | POA: Diagnosis not present

## 2019-04-21 DIAGNOSIS — M7542 Impingement syndrome of left shoulder: Secondary | ICD-10-CM | POA: Diagnosis not present

## 2019-04-21 DIAGNOSIS — Z87891 Personal history of nicotine dependence: Secondary | ICD-10-CM | POA: Diagnosis not present

## 2019-04-21 DIAGNOSIS — M25812 Other specified joint disorders, left shoulder: Secondary | ICD-10-CM | POA: Diagnosis not present

## 2019-04-21 DIAGNOSIS — Z9851 Tubal ligation status: Secondary | ICD-10-CM | POA: Diagnosis not present

## 2019-04-21 DIAGNOSIS — M75112 Incomplete rotator cuff tear or rupture of left shoulder, not specified as traumatic: Secondary | ICD-10-CM | POA: Diagnosis not present

## 2019-04-21 DIAGNOSIS — M75102 Unspecified rotator cuff tear or rupture of left shoulder, not specified as traumatic: Secondary | ICD-10-CM | POA: Diagnosis not present

## 2019-04-21 DIAGNOSIS — F329 Major depressive disorder, single episode, unspecified: Secondary | ICD-10-CM | POA: Diagnosis not present

## 2019-04-21 DIAGNOSIS — G8918 Other acute postprocedural pain: Secondary | ICD-10-CM | POA: Diagnosis not present

## 2019-04-21 DIAGNOSIS — M7502 Adhesive capsulitis of left shoulder: Secondary | ICD-10-CM | POA: Diagnosis not present

## 2019-04-21 DIAGNOSIS — E119 Type 2 diabetes mellitus without complications: Secondary | ICD-10-CM | POA: Diagnosis not present

## 2019-04-21 DIAGNOSIS — F419 Anxiety disorder, unspecified: Secondary | ICD-10-CM | POA: Diagnosis not present

## 2019-04-29 DIAGNOSIS — M25512 Pain in left shoulder: Secondary | ICD-10-CM | POA: Diagnosis not present

## 2019-04-29 DIAGNOSIS — M7502 Adhesive capsulitis of left shoulder: Secondary | ICD-10-CM | POA: Diagnosis not present

## 2019-04-29 DIAGNOSIS — M7542 Impingement syndrome of left shoulder: Secondary | ICD-10-CM | POA: Diagnosis not present

## 2019-05-06 DIAGNOSIS — M25512 Pain in left shoulder: Secondary | ICD-10-CM | POA: Diagnosis not present

## 2019-05-06 DIAGNOSIS — M7542 Impingement syndrome of left shoulder: Secondary | ICD-10-CM | POA: Diagnosis not present

## 2019-05-06 DIAGNOSIS — M7502 Adhesive capsulitis of left shoulder: Secondary | ICD-10-CM | POA: Diagnosis not present

## 2019-05-14 DIAGNOSIS — M25512 Pain in left shoulder: Secondary | ICD-10-CM | POA: Diagnosis not present

## 2019-05-14 DIAGNOSIS — M7502 Adhesive capsulitis of left shoulder: Secondary | ICD-10-CM | POA: Diagnosis not present

## 2019-05-14 DIAGNOSIS — M7542 Impingement syndrome of left shoulder: Secondary | ICD-10-CM | POA: Diagnosis not present

## 2019-05-19 DIAGNOSIS — M25512 Pain in left shoulder: Secondary | ICD-10-CM | POA: Diagnosis not present

## 2019-05-25 DIAGNOSIS — M25512 Pain in left shoulder: Secondary | ICD-10-CM | POA: Diagnosis not present

## 2019-06-01 DIAGNOSIS — M25512 Pain in left shoulder: Secondary | ICD-10-CM | POA: Diagnosis not present

## 2019-06-02 DIAGNOSIS — Z6830 Body mass index (BMI) 30.0-30.9, adult: Secondary | ICD-10-CM | POA: Diagnosis not present

## 2019-06-02 DIAGNOSIS — E119 Type 2 diabetes mellitus without complications: Secondary | ICD-10-CM | POA: Diagnosis not present

## 2019-06-02 DIAGNOSIS — F909 Attention-deficit hyperactivity disorder, unspecified type: Secondary | ICD-10-CM | POA: Diagnosis not present

## 2019-06-02 DIAGNOSIS — E6609 Other obesity due to excess calories: Secondary | ICD-10-CM | POA: Diagnosis not present

## 2019-06-03 DIAGNOSIS — E6609 Other obesity due to excess calories: Secondary | ICD-10-CM | POA: Diagnosis not present

## 2019-06-03 DIAGNOSIS — E119 Type 2 diabetes mellitus without complications: Secondary | ICD-10-CM | POA: Diagnosis not present

## 2019-06-03 DIAGNOSIS — Z6830 Body mass index (BMI) 30.0-30.9, adult: Secondary | ICD-10-CM | POA: Diagnosis not present

## 2019-07-01 DIAGNOSIS — Z9189 Other specified personal risk factors, not elsewhere classified: Secondary | ICD-10-CM | POA: Diagnosis not present

## 2019-07-22 DIAGNOSIS — N951 Menopausal and female climacteric states: Secondary | ICD-10-CM | POA: Diagnosis not present

## 2019-07-22 DIAGNOSIS — Z111 Encounter for screening for respiratory tuberculosis: Secondary | ICD-10-CM | POA: Diagnosis not present

## 2019-07-22 DIAGNOSIS — Z23 Encounter for immunization: Secondary | ICD-10-CM | POA: Diagnosis not present

## 2019-09-07 DIAGNOSIS — Z1389 Encounter for screening for other disorder: Secondary | ICD-10-CM | POA: Diagnosis not present

## 2019-09-07 DIAGNOSIS — S8010XA Contusion of unspecified lower leg, initial encounter: Secondary | ICD-10-CM | POA: Diagnosis not present

## 2019-09-07 DIAGNOSIS — E119 Type 2 diabetes mellitus without complications: Secondary | ICD-10-CM | POA: Diagnosis not present

## 2019-09-07 DIAGNOSIS — E663 Overweight: Secondary | ICD-10-CM | POA: Diagnosis not present

## 2019-09-07 DIAGNOSIS — Z6829 Body mass index (BMI) 29.0-29.9, adult: Secondary | ICD-10-CM | POA: Diagnosis not present

## 2019-09-11 DIAGNOSIS — E119 Type 2 diabetes mellitus without complications: Secondary | ICD-10-CM | POA: Diagnosis not present

## 2019-09-11 DIAGNOSIS — Z0000 Encounter for general adult medical examination without abnormal findings: Secondary | ICD-10-CM | POA: Diagnosis not present

## 2019-09-11 DIAGNOSIS — E663 Overweight: Secondary | ICD-10-CM | POA: Diagnosis not present

## 2019-09-11 DIAGNOSIS — F329 Major depressive disorder, single episode, unspecified: Secondary | ICD-10-CM | POA: Diagnosis not present

## 2020-03-24 DIAGNOSIS — F909 Attention-deficit hyperactivity disorder, unspecified type: Secondary | ICD-10-CM | POA: Diagnosis not present

## 2020-04-14 ENCOUNTER — Encounter: Payer: BLUE CROSS/BLUE SHIELD | Admitting: Obstetrics & Gynecology

## 2020-04-21 DIAGNOSIS — E119 Type 2 diabetes mellitus without complications: Secondary | ICD-10-CM | POA: Diagnosis not present

## 2020-04-21 DIAGNOSIS — E6609 Other obesity due to excess calories: Secondary | ICD-10-CM | POA: Diagnosis not present

## 2020-04-21 DIAGNOSIS — F320 Major depressive disorder, single episode, mild: Secondary | ICD-10-CM | POA: Diagnosis not present

## 2020-04-21 DIAGNOSIS — Z6831 Body mass index (BMI) 31.0-31.9, adult: Secondary | ICD-10-CM | POA: Diagnosis not present

## 2020-04-21 DIAGNOSIS — F909 Attention-deficit hyperactivity disorder, unspecified type: Secondary | ICD-10-CM | POA: Diagnosis not present

## 2020-04-21 DIAGNOSIS — F419 Anxiety disorder, unspecified: Secondary | ICD-10-CM | POA: Diagnosis not present

## 2020-07-19 ENCOUNTER — Ambulatory Visit (HOSPITAL_COMMUNITY): Payer: BC Managed Care – PPO | Admitting: Hematology

## 2020-08-04 DIAGNOSIS — J209 Acute bronchitis, unspecified: Secondary | ICD-10-CM | POA: Diagnosis not present

## 2020-08-18 DIAGNOSIS — F909 Attention-deficit hyperactivity disorder, unspecified type: Secondary | ICD-10-CM | POA: Diagnosis not present

## 2020-09-18 DIAGNOSIS — D72820 Lymphocytosis (symptomatic): Secondary | ICD-10-CM | POA: Insufficient documentation

## 2020-09-19 ENCOUNTER — Ambulatory Visit (HOSPITAL_COMMUNITY): Payer: BC Managed Care – PPO | Admitting: Hematology

## 2020-09-19 DIAGNOSIS — D72820 Lymphocytosis (symptomatic): Secondary | ICD-10-CM

## 2020-10-19 DIAGNOSIS — R209 Unspecified disturbances of skin sensation: Secondary | ICD-10-CM | POA: Diagnosis not present

## 2020-10-19 DIAGNOSIS — E1165 Type 2 diabetes mellitus with hyperglycemia: Secondary | ICD-10-CM | POA: Diagnosis not present

## 2020-10-19 DIAGNOSIS — Z6833 Body mass index (BMI) 33.0-33.9, adult: Secondary | ICD-10-CM | POA: Diagnosis not present

## 2020-10-19 DIAGNOSIS — F320 Major depressive disorder, single episode, mild: Secondary | ICD-10-CM | POA: Diagnosis not present

## 2020-10-19 DIAGNOSIS — E6609 Other obesity due to excess calories: Secondary | ICD-10-CM | POA: Diagnosis not present

## 2020-10-19 DIAGNOSIS — F419 Anxiety disorder, unspecified: Secondary | ICD-10-CM | POA: Diagnosis not present

## 2020-10-19 LAB — MICROALBUMIN, URINE: Microalb, Ur: 4

## 2020-10-19 LAB — HEMOGLOBIN A1C: Hemoglobin A1C: 8.2

## 2020-11-22 ENCOUNTER — Encounter: Payer: Self-pay | Admitting: Neurology

## 2021-01-03 DIAGNOSIS — F909 Attention-deficit hyperactivity disorder, unspecified type: Secondary | ICD-10-CM | POA: Diagnosis not present

## 2021-01-04 ENCOUNTER — Telehealth: Payer: Self-pay

## 2021-01-04 NOTE — Telephone Encounter (Signed)
Called pt for a an appt in regards to a referral we received on her from [place]. VM Full. Referral in green folder

## 2021-01-27 ENCOUNTER — Encounter: Payer: Self-pay | Admitting: Neurology

## 2021-01-27 ENCOUNTER — Other Ambulatory Visit: Payer: Self-pay

## 2021-01-27 ENCOUNTER — Ambulatory Visit (INDEPENDENT_AMBULATORY_CARE_PROVIDER_SITE_OTHER): Payer: BC Managed Care – PPO | Admitting: Neurology

## 2021-01-27 VITALS — BP 110/76 | HR 93 | Ht 62.0 in | Wt 178.0 lb

## 2021-01-27 DIAGNOSIS — F17200 Nicotine dependence, unspecified, uncomplicated: Secondary | ICD-10-CM | POA: Diagnosis not present

## 2021-01-27 DIAGNOSIS — R292 Abnormal reflex: Secondary | ICD-10-CM

## 2021-01-27 DIAGNOSIS — R200 Anesthesia of skin: Secondary | ICD-10-CM | POA: Diagnosis not present

## 2021-01-27 NOTE — Patient Instructions (Signed)
MRI brain wwo contrast  Tobacco cessation recommended

## 2021-01-27 NOTE — Progress Notes (Signed)
[facility] Clinic Note - Initial Visit   Date: 01/27/21  Olivia Church MRN: [id number] DOB: [birth year]   Dear Collene Mares, PA-C:  Thank you for your kind referral of Olivia Church for consultation of numbness/tingling. Although her history is well known to you, please allow [place] to reiterate it for the purpose of our medical record. The patient was accompanied to the clinic by husband who also provides collateral information.     History of Present Illness: Olivia Church is a 40 y.o. right-handed female with diabetes mellitus, ADHD, anxiety/depression, tobacco use presenting for evaluation of right side numbness/tingling.  She was depressed in March 2022, she was sleeping and laying a lot.  She noticed that her right leg was asleep. Now, she has entire right body (face, back, chest, legs) numbness, cold sensation, and burning.  Symptoms are constant.  Nothing alleviates her symptoms. No weakness.  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   She also complains of hearing loss and was seeing ENT.    Out-side paper records, electronic medical record, and images have been reviewed where available and summarized as:  Lab Results  Component Value Date   TSH 1.01 04/16/2012    Past Medical History:  Diagnosis Date   Anxiety    Asthma    Bursitis    Bursitis    right hip   Chronic UTI (urinary tract infection)    Depression    Diabetes mellitus ([place])    diet controlled   Dysmenorrhea 03/04/2015   Irregular periods 03/04/2015   Vaginal discharge 03/04/2015    Past Surgical History:  Procedure Laterality Date   DILITATION & CURRETTAGE/HYSTROSCOPY WITH NOVASURE ABLATION N/A 04/29/2015   Procedure: DILATATION & CURETTAGE/HYSTEROSCOPY WITH NOVASURE ABLATION;  Surgeon: [name], MD;  Location: [facility];  Service: Gynecology;  Laterality: N/A;   TUBAL LIGATION  01-04-01     Medications:   Outpatient Encounter Medications as of 01/27/2021  Medication Sig Note   acetaminophen-codeine (TYLENOL #3) 300-30 MG tablet Take 1-2 tablets by mouth every 6 (six) hours as needed for moderate pain.    albuterol (PROVENTIL HFA;VENTOLIN HFA) 108 (90 BASE) MCG/ACT inhaler Inhale 1-2 puffs into the lungs every 6 (six) hours as needed for wheezing or shortness of breath.     amphetamine-dextroamphetamine (ADDERALL XR) 30 MG 24 hr capsule Take 1 capsule by mouth daily.    amphetamine-dextroamphetamine (ADDERALL) 10 MG tablet Take 10 mg by mouth daily.    aspirin EC 81 MG tablet Take 81 mg by mouth daily. Swallow whole.    clonazePAM (KLONOPIN) 1 MG tablet Take 1 mg by mouth 2 (two) times daily.    diphenhydrAMINE (BENADRYL) 25 MG tablet Take 25 mg by mouth every 6 (six) hours as needed for itching or allergies.    diphenhydramine-acetaminophen (TYLENOL PM) 25-500 MG TABS tablet Take 1 tablet by mouth at bedtime as needed (pain/sleep).    glimepiride (AMARYL) 1 MG tablet Take 1 mg by mouth daily.    metFORMIN (GLUCOPHAGE-XR) 750 MG 24 hr tablet     [DISCONTINUED] amoxicillin (AMOXIL) 500 MG capsule Take 1 capsule (500 mg total) by mouth 3 (three) times daily.    [DISCONTINUED] cefdinir (OMNICEF) 300 MG capsule Take 1 capsule (300 mg total) by mouth 2 (two) times daily.    [DISCONTINUED] CHANTIX STARTING MONTH PAK 0.5 MG X 11 & 1 MG X 42 tablet Take 1 mg by mouth 2 (two) times daily.  07/23/2015: Patient is supposed to be on the continuing pack but the pharmacy filled the starter pack twice   [DISCONTINUED] fluconazole (DIFLUCAN) 150 MG tablet Take one tablet by mouth today and repeat in 3 days if needed    [DISCONTINUED] fluconazole (DIFLUCAN) 150 MG tablet TAKE ONE TABLET BY MOUTH TODAY AND REPEAT IN 3 DAYS IF NEEDED    [DISCONTINUED] fluconazole (DIFLUCAN) 150 MG tablet TAKE ONE TABLET BY MOUTH AS A ONE-TIME DOSE (REPEAT IN 3 DAYS IF NEEDED)    [DISCONTINUED] FLUoxetine (PROZAC) 40 MG capsule Take 40  mg by mouth daily. 07/23/2015: Received from: External Pharmacy Received Sig:    [DISCONTINUED] HYDROcodone-acetaminophen (NORCO/VICODIN) 5-325 MG tablet Take 2 tablets by mouth every 4 (four) hours as needed.    [DISCONTINUED] HYDROMET 5-1.5 MG/5ML syrup Take 5 mLs by mouth every 6 (six) hours as needed for cough.  07/23/2015: Received from: External Pharmacy   [DISCONTINUED] JARDIANCE 25 MG TABS tablet Take 25 mg by mouth daily. 07/23/2015: Received from: External Pharmacy Received Sig:    [DISCONTINUED] metroNIDAZOLE (FLAGYL) 500 MG tablet Take 1 tablet (500 mg total) by mouth 2 (two) times daily. (Patient not taking: Reported on 07/23/2015)    [DISCONTINUED] neomycin-polymyxin-hydrocortisone (CORTISPORIN) 3.5-10000-1 otic suspension Place 4 drops into both ears 3 (three) times daily.    No facility-administered encounter medications on file as of 01/27/2021.    Allergies: No Known Allergies  Family History: Family History  Problem Relation Age of Onset   Diabetes Mother    Ovarian cancer Mother    Colon cancer Neg Hx     Social History: Social History   Tobacco Use   Smoking status: Every Day    Packs/day: 1.50    Years: 15.00    Pack years: 22.50    Types: Cigarettes    Last attempt to quit: 06/01/2015    Years since quitting: 5.6   Smokeless tobacco: Never   Tobacco comments:    tobacco info given 04/16/12  Substance Use Topics   Alcohol use: Yes    Comment: rare   Drug use: No   Social History   Social History Narrative   Not on file    Vital Signs:  BP 110/76   Pulse 93   Ht 5\' 2"  (1.575 m)   Wt 178 lb (80.7 kg)   SpO2 97%   BMI 32.56 kg/m     Neurological Exam: MENTAL STATUS including orientation to time, place, person, recent and remote memory, attention span and concentration, language, and fund of knowledge is normal.  Speech is not dysarthric.  CRANIAL NERVES: II:  No visual field defects.  Unremarkable fundi.   III-IV-VI: Pupils equal round and reactive  to light.  Normal conjugate, extra-ocular eye movements in all directions of gaze.  No nystagmus.  No ptosis.   V:  Normal facial sensation.    VII:  Normal facial symmetry and movements.   VIII:  Normal hearing and vestibular function.   IX-X:  Normal palatal movement.   XI:  Normal shoulder shrug and head rotation.   XII:  Normal tongue strength and range of motion, no deviation or fasciculation.  MOTOR:  No atrophy, fasciculations or abnormal movements.  No pronator drift.   Upper Extremity:  Right  Left  Deltoid  5/5   5/5   Biceps  5/5   5/5   Triceps  5/5   5/5   Infraspinatus 5/5  5/5  Medial pectoralis 5/5  5/5  Wrist extensors  5/5  5/5   Wrist flexors  5/5   5/5   Finger extensors  5/5   5/5   Finger flexors  5/5   5/5   Dorsal interossei  5/5   5/5   Abductor pollicis  5/5   5/5   Tone (Ashworth scale)  0  0   Lower Extremity:  Right  Left  Hip flexors  5/5   5/5   Hip extensors  5/5   5/5   Adductor 5/5  5/5  Abductor 5/5  5/5  Knee flexors  5/5   5/5   Knee extensors  5/5   5/5   Dorsiflexors  5/5   5/5   Plantarflexors  5/5   5/5   Toe extensors  5/5   5/5   Toe flexors  5/5   5/5   Tone (Ashworth scale)  0  0   MSRs:  Right        Left                  brachioradialis 2+  2+  biceps 2+  2+  triceps 2+  2+  patellar 3+  3+  ankle jerk 2+  2+  Hoffman no  no  plantar response down  down   SENSORY:  Reduced temperature over the lateral right leg.  Pin prick and vibration is intact throughout. There is no sensory level. Romberg's sign absent.   COORDINATION/GAIT: Normal finger-to- nose-finger and heel-to-shin.  Intact rapid alternating movements bilaterally.  Gait narrow based and stable. Tandem and stressed gait intact.    IMPRESSION: Right hemisensory numbness/tingling.  Neurological exam is notable for brisk patella reflexes, mild sensory loss in the right leg.    - MRI brain wwo contrast to evaluate for structural pathology  2. Tobacco use  disorder  - Tobacco cessation advised  Further recommendations pending results.    Thank you for allowing me to participate in patient's care.  If I can answer any additional questions, I would be pleased to do so.    Sincerely,    Avalie Oconnor K. Posey Pronto, DO

## 2021-02-06 NOTE — Patient Instructions (Signed)
Diabetes Mellitus and Nutrition, Adult When you have diabetes, or diabetes mellitus, it is very important to have healthy eating habits because your blood sugar (glucose) levels are greatly affected by what you eat and drink. Eating healthy foods in the right amounts, at about the same times every day, can help you:  Control your blood glucose.  Lower your risk of heart disease.  Improve your blood pressure.  Reach or maintain a healthy weight. What can affect my meal plan? Every person with diabetes is different, and each person has different needs for a meal plan. Your health care provider may recommend that you work with a dietitian to make a meal plan that is best for you. Your meal plan may vary depending on factors such as:  The calories you need.  The medicines you take.  Your weight.  Your blood glucose, blood pressure, and cholesterol levels.  Your activity level.  Other health conditions you have, such as heart or kidney disease. How do carbohydrates affect me? Carbohydrates, also called carbs, affect your blood glucose level more than any other type of food. Eating carbs naturally raises the amount of glucose in your blood. Carb counting is a method for keeping track of how many carbs you eat. Counting carbs is important to keep your blood glucose at a healthy level, especially if you use insulin or take certain oral diabetes medicines. It is important to know how many carbs you can safely have in each meal. This is different for every person. Your dietitian can help you calculate how many carbs you should have at each meal and for each snack. How does alcohol affect me? Alcohol can cause a sudden decrease in blood glucose (hypoglycemia), especially if you use insulin or take certain oral diabetes medicines. Hypoglycemia can be a life-threatening condition. Symptoms of hypoglycemia, such as sleepiness, dizziness, and confusion, are similar to symptoms of having too much  alcohol.  Do not drink alcohol if: ? Your health care provider tells you not to drink. ? You are pregnant, may be pregnant, or are planning to become pregnant.  If you drink alcohol: ? Do not drink on an empty stomach. ? Limit how much you use to:  0-1 drink a day for women.  0-2 drinks a day for men. ? Be aware of how much alcohol is in your drink. In the [place] one drink equals one 12 oz bottle of beer (355 mL), one 5 oz glass of wine (148 mL), or one 1 oz glass of hard liquor (44 mL). ? Keep yourself hydrated with water, diet soda, or unsweetened iced tea.  Keep in mind that regular soda, juice, and other mixers may contain a lot of sugar and must be counted as carbs. What are tips for following this plan? Reading food labels  Start by checking the serving size on the "Nutrition Facts" label of packaged foods and drinks. The amount of calories, carbs, fats, and other nutrients listed on the label is based on one serving of the item. Many items contain more than one serving per package.  Check the total grams (g) of carbs in one serving. You can calculate the number of servings of carbs in one serving by dividing the total carbs by 15. For example, if a food has 30 g of total carbs per serving, it would be equal to 2 servings of carbs.  Check the number of grams (g) of saturated fats and trans fats in one serving. Choose foods that have   a low amount or none of these fats.  Check the number of milligrams (mg) of salt (sodium) in one serving. Most people should limit total sodium intake to less than 2,300 mg per day.  Always check the nutrition information of foods labeled as "low-fat" or "nonfat." These foods may be higher in added sugar or refined carbs and should be avoided.  Talk to your dietitian to identify your daily goals for nutrients listed on the label. Shopping  Avoid buying canned, pre-made, or processed foods. These foods tend to be high in fat, sodium, and added  sugar.  Shop around the outside edge of the grocery store. This is where you will most often find fresh fruits and vegetables, bulk grains, fresh meats, and fresh dairy. Cooking  Use low-heat cooking methods, such as baking, instead of high-heat cooking methods like deep frying.  Cook using healthy oils, such as olive, canola, or sunflower oil.  Avoid cooking with butter, cream, or high-fat meats. Meal planning  Eat meals and snacks regularly, preferably at the same times every day. Avoid going long periods of time without eating.  Eat foods that are high in fiber, such as fresh fruits, vegetables, beans, and whole grains. Talk with your dietitian about how many servings of carbs you can eat at each meal.  Eat 4-6 oz (112-168 g) of lean protein each day, such as lean meat, chicken, fish, eggs, or tofu. One ounce (oz) of lean protein is equal to: ? 1 oz (28 g) of meat, chicken, or fish. ? 1 egg. ?  cup (62 g) of tofu.  Eat some foods each day that contain healthy fats, such as avocado, nuts, seeds, and fish.   What foods should I eat? Fruits Berries. Apples. Oranges. Peaches. Apricots. Plums. Grapes. Mango. Papaya. Pomegranate. Kiwi. Cherries. Vegetables Lettuce. Spinach. Leafy greens, including kale, chard, collard greens, and mustard greens. Beets. Cauliflower. Cabbage. Broccoli. Carrots. Green beans. Tomatoes. Peppers. Onions. Cucumbers. Brussels sprouts. Grains Whole grains, such as whole-wheat or whole-grain bread, crackers, tortillas, cereal, and pasta. Unsweetened oatmeal. Quinoa. Brown or wild rice. Meats and other proteins Seafood. Poultry without skin. Lean cuts of poultry and beef. Tofu. Nuts. Seeds. Dairy Low-fat or fat-free dairy products such as milk, yogurt, and cheese. The items listed above may not be a complete list of foods and beverages you can eat. Contact a dietitian for more information. What foods should I avoid? Fruits Fruits canned with  syrup. Vegetables Canned vegetables. Frozen vegetables with butter or cream sauce. Grains Refined white flour and flour products such as bread, pasta, snack foods, and cereals. Avoid all processed foods. Meats and other proteins Fatty cuts of meat. Poultry with skin. Breaded or fried meats. Processed meat. Avoid saturated fats. Dairy Full-fat yogurt, cheese, or milk. Beverages Sweetened drinks, such as soda or iced tea. The items listed above may not be a complete list of foods and beverages you should avoid. Contact a dietitian for more information. Questions to ask a health care provider  Do I need to meet with a diabetes educator?  Do I need to meet with a dietitian?  What number can I call if I have questions?  When are the best times to check my blood glucose? Where to find more information:  American Diabetes Association: diabetes.org  Academy of Nutrition and Dietetics: www.eatright.org  National Institute of Diabetes and Digestive and Kidney Diseases: www.niddk.nih.gov  Association of Diabetes Care and Education Specialists: www.diabeteseducator.org Summary  It is important to have healthy eating   habits because your blood sugar (glucose) levels are greatly affected by what you eat and drink.  A healthy meal plan will help you control your blood glucose and maintain a healthy lifestyle.  Your health care provider may recommend that you work with a dietitian to make a meal plan that is best for you.  Keep in mind that carbohydrates (carbs) and alcohol have immediate effects on your blood glucose levels. It is important to count carbs and to use alcohol carefully. This information is not intended to replace advice given to you by your health care provider. Make sure you discuss any questions you have with your health care provider. Document Revised: 06/09/2019 Document Reviewed: 06/09/2019 Elsevier Patient Education  2021 Elsevier Inc.  

## 2021-02-07 ENCOUNTER — Encounter: Payer: Managed Care, Other (non HMO) | Admitting: Nurse Practitioner

## 2021-02-07 DIAGNOSIS — E119 Type 2 diabetes mellitus without complications: Secondary | ICD-10-CM

## 2021-02-09 DIAGNOSIS — J22 Unspecified acute lower respiratory infection: Secondary | ICD-10-CM | POA: Diagnosis not present

## 2021-02-10 ENCOUNTER — Other Ambulatory Visit: Payer: BC Managed Care – PPO

## 2021-03-03 ENCOUNTER — Ambulatory Visit: Payer: BC Managed Care – PPO | Admitting: Nurse Practitioner

## 2021-03-16 ENCOUNTER — Encounter (HOSPITAL_COMMUNITY): Payer: Self-pay | Admitting: *Deleted

## 2021-03-16 ENCOUNTER — Ambulatory Visit
Admission: RE | Admit: 2021-03-16 | Discharge: 2021-03-16 | Disposition: A | Discharge status: Home / Self Care | Payer: BC Managed Care – PPO | Source: Ambulatory Visit | Attending: Neurology | Admitting: Neurology

## 2021-03-16 DIAGNOSIS — D72820 Lymphocytosis (symptomatic): Secondary | ICD-10-CM

## 2021-03-16 DIAGNOSIS — D72829 Elevated white blood cell count, unspecified: Secondary | ICD-10-CM

## 2021-03-16 DIAGNOSIS — F17200 Nicotine dependence, unspecified, uncomplicated: Secondary | ICD-10-CM

## 2021-03-16 DIAGNOSIS — R292 Abnormal reflex: Secondary | ICD-10-CM

## 2021-03-16 DIAGNOSIS — R531 Weakness: Secondary | ICD-10-CM | POA: Diagnosis not present

## 2021-03-16 DIAGNOSIS — R200 Anesthesia of skin: Secondary | ICD-10-CM | POA: Diagnosis not present

## 2021-03-16 HISTORY — DX: Elevated white blood cell count, unspecified: D72.829

## 2021-03-16 HISTORY — DX: Lymphocytosis (symptomatic): D72.820

## 2021-03-16 MED ORDER — GADOBENATE DIMEGLUMINE 529 MG/ML IV SOLN
16.0000 mL | Freq: Once | INTRAVENOUS | Status: AC | PRN
  Administered 2021-03-16: 16 mL via INTRAVENOUS

## 2021-03-17 ENCOUNTER — Other Ambulatory Visit: Payer: Self-pay

## 2021-03-17 ENCOUNTER — Inpatient Hospital Stay (HOSPITAL_COMMUNITY): Payer: BC Managed Care – PPO

## 2021-03-17 ENCOUNTER — Telehealth: Payer: Self-pay | Admitting: Genetic Counselor

## 2021-03-17 ENCOUNTER — Encounter (HOSPITAL_COMMUNITY): Payer: Self-pay | Admitting: Hematology and Oncology

## 2021-03-17 ENCOUNTER — Inpatient Hospital Stay (HOSPITAL_COMMUNITY): Payer: BC Managed Care – PPO | Attending: Hematology | Admitting: Hematology and Oncology

## 2021-03-17 VITALS — BP 133/80 | HR 96 | Temp 97.5°F | Resp 16 | Wt 175.8 lb

## 2021-03-17 DIAGNOSIS — D72829 Elevated white blood cell count, unspecified: Secondary | ICD-10-CM | POA: Insufficient documentation

## 2021-03-17 DIAGNOSIS — D72820 Lymphocytosis (symptomatic): Secondary | ICD-10-CM

## 2021-03-17 DIAGNOSIS — Z8041 Family history of malignant neoplasm of ovary: Secondary | ICD-10-CM | POA: Insufficient documentation

## 2021-03-17 DIAGNOSIS — R5383 Other fatigue: Secondary | ICD-10-CM | POA: Diagnosis not present

## 2021-03-17 LAB — COMPREHENSIVE METABOLIC PANEL
ALT: 23 U/L (ref 0–44)
AST: 19 U/L (ref 15–41)
Albumin: 4 g/dL (ref 3.5–5.0)
Alkaline Phosphatase: 73 U/L (ref 38–126)
Anion gap: 6 (ref 5–15)
BUN: 8 mg/dL (ref 6–20)
CO2: 19 mmol/L — ABNORMAL LOW (ref 22–32)
Calcium: 8.4 mg/dL — ABNORMAL LOW (ref 8.9–10.3)
Chloride: 105 mmol/L (ref 98–111)
Creatinine, Ser: 0.52 mg/dL (ref 0.44–1.00)
GFR, Estimated: >60 mL/min (ref >60)
Glucose, Bld: 239 mg/dL — ABNORMAL HIGH (ref 70–99)
Potassium: 3.7 mmol/L (ref 3.5–5.1)
Sodium: 130 mmol/L — ABNORMAL LOW (ref 135–145)
Total Bilirubin: 0.2 mg/dL — ABNORMAL LOW (ref 0.3–1.2)
Total Protein: 6.7 g/dL (ref 6.5–8.1)

## 2021-03-17 LAB — CBC WITH DIFFERENTIAL/PLATELET
Abs Immature Granulocytes: 0.03 10*3/uL (ref 0.00–0.07)
Basophils Absolute: 0.1 10*3/uL (ref 0.0–0.1)
Basophils Relative: 1 %
Eosinophils Absolute: 0.1 10*3/uL (ref 0.0–0.5)
Eosinophils Relative: 1 %
HCT: 45.3 % (ref 36.0–46.0)
Hemoglobin: 15.6 g/dL — ABNORMAL HIGH (ref 12.0–15.0)
Immature Granulocytes: 0 %
Lymphocytes Relative: 33 %
Lymphs Abs: 3.9 10*3/uL (ref 0.7–4.0)
MCH: 30.6 pg (ref 26.0–34.0)
MCHC: 34.4 g/dL (ref 30.0–36.0)
MCV: 88.8 fL (ref 80.0–100.0)
Monocytes Absolute: 0.8 10*3/uL (ref 0.1–1.0)
Monocytes Relative: 7 %
Neutro Abs: 6.9 10*3/uL (ref 1.7–7.7)
Neutrophils Relative %: 58 %
Platelets: 348 10*3/uL (ref 150–400)
RBC: 5.1 MIL/uL (ref 3.87–5.11)
RDW: 12.4 % (ref 11.5–15.5)
WBC: 11.9 10*3/uL — ABNORMAL HIGH (ref 4.0–10.5)
nRBC: 0 % (ref 0.0–0.2)

## 2021-03-17 LAB — SEDIMENTATION RATE: Sed Rate: 1 mm/hr (ref 0–22)

## 2021-03-17 LAB — LACTATE DEHYDROGENASE: LDH: 95 U/L — ABNORMAL LOW (ref 98–192)

## 2021-03-17 NOTE — Progress Notes (Signed)
[place] NOTE  Patient Care Team: Sharilyn Sites, MD as PCP - General (Family Medicine)  CHIEF COMPLAINTS/PURPOSE OF CONSULTATION:  Lymphocytosis.  ASSESSMENT & PLAN:   Orders Placed This Encounter  Procedures   CBC with Differential/Platelet    Standing Status:   Standing    Number of Occurrences:   22    Standing Expiration Date:   03/17/2022   Comprehensive metabolic panel    Standing Status:   Standing    Number of Occurrences:   33    Standing Expiration Date:   03/17/2022   Lactate dehydrogenase    Standing Status:   Future    Number of Occurrences:   1    Standing Expiration Date:   03/17/2022   ANA, IFA (with reflex)    Standing Status:   Future    Number of Occurrences:   1    Standing Expiration Date:   03/17/2022   Sedimentation rate    Standing Status:   Future    Number of Occurrences:   1    Standing Expiration Date:   03/17/2022   Ambulatory referral to Genetics    Referral Priority:   Routine    Referral Type:   Consultation    Referral Reason:   Specialty Services Required    Number of Visits Requested:   1   This is a very pleasant 40 year old female patient with some ongoing right-sided temperature perception changes, fatigue, easy bruising referred to hematology for evaluation of lymphocytosis.  Review of systems as mentioned below pertinent for fatigue, no other B symptoms, right side always feels cold and she bruises easily. Physical examination healthy-appearing female patient, no major findings.  I have reviewed her labs from 2016, 2017 and from early 2021, she always has had some degree of leukocytosis and mild lymphocytosis, this has not progressed over the years at least from the labs we have reviewed. I have discussed that common cause of lymphocytosis stress, pain, autoimmune diseases, smoking and rarely lymphoproliferative disorders.  Given the fluctuating lymphocyte count and improvement, lymphoproliferative disorder seems less  likely.  We will repeat a CBC, CMP, ESR, LDH today.  If we notice lymphocyte count over 5000, will ask for flow cytometry.  She understands that she may have to come back for repeat lab. Mom had ovarian cancer, genetics referral sent, patient agreeable. She expressed understanding of all the recommendations.  She will return to clinic for televisit to review labs and to discuss any additional recommendations. Thank you for consulting [place] in the care of this patient.  Please do not hesitate to contact us with any additional questions or concerns.  HISTORY OF PRESENTING ILLNESS:  Olivia Church 40 y.o. female is here because of lymphocytosis.  This is a very pleasant 40 year old female patient referred to hematology for evaluation of lymphocytosis.  We have originally received this consult back in October 2021.  She mentions to me that she has been having this right-sided change in temperature perception.  She always feels cold on the right side.  She has never noticed a skin rash on the right side or pain only on the right side.  She has had an MRI brain yesterday, ordered by her PCP.  She was also noted to have some lymphocytosis and hence referred to hematology.  She complains of fatigue but no ongoing drenching night sweats (last episode may be a year ago), loss of appetite or loss of weight.  She denies any changes  in breathing except related to smoking.  She smokes a pack a day.  No change in bowel habits or urinary habits.  She has an occasional yeast infection.  No new neurological complaints.  She was worried about the elevated lymphocytes.  Rest of the review of systems pertinent for intermittent right neck swelling, cough and shortness of breath related to likely smoking, bruising, insomnia, episodes of dizziness.  MEDICAL HISTORY:  Past Medical History:  Diagnosis Date   Anxiety    Asthma    Bursitis    Bursitis    right hip   Chronic UTI (urinary tract infection)    Depression     Diabetes mellitus ([place])    diet controlled   Dysmenorrhea 03/04/2015   Irregular periods 03/04/2015   Vaginal discharge 03/04/2015    SURGICAL HISTORY: Past Surgical History:  Procedure Laterality Date   DILITATION & CURRETTAGE/HYSTROSCOPY WITH NOVASURE ABLATION N/A 04/29/2015   Procedure: DILATATION & CURETTAGE/HYSTEROSCOPY WITH NOVASURE ABLATION;  Surgeon: [name], MD;  Location: [facility];  Service: Gynecology;  Laterality: N/A;   TUBAL LIGATION  01-04-01    SOCIAL HISTORY: Social History   Socioeconomic History   Marital status: Married    Spouse name: Not on file   Number of children: 3   Years of education: Not on file   Highest education level: Not on file  Occupational History   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   Social Determinants of Health   Financial Resource Strain: Low Risk    Difficulty of Paying Living Expenses: Not hard at all  Food Insecurity: No Food Insecurity   Worried About Charity fundraiser in the Last Year: Never true   [place] in the Last Year: Never true  Transportation Needs: No Transportation Needs   Lack of Transportation (Medical): No   Lack of Transportation (Non-Medical): No  Physical Activity: Inactive   Days of Exercise per Week: 0 days   Minutes of Exercise per Session: 0 min  Stress: Stress Concern Present   Feeling of Stress : To some extent  Social Connections: Not on file   Intimate Partner Violence: Not on file    FAMILY HISTORY: Family History  Problem Relation Age of Onset   Diabetes Mother    Ovarian cancer Mother    Colon cancer Neg Hx     ALLERGIES:  has No Known Allergies.  MEDICATIONS:  Current Outpatient Medications  Medication Sig Dispense Refill   albuterol (PROVENTIL HFA;VENTOLIN HFA) 108 (90 BASE) MCG/ACT inhaler Inhale 1-2 puffs into the lungs every 6 (six) hours as needed for wheezing or shortness of breath.      amphetamine-dextroamphetamine (ADDERALL XR) 30 MG 24 hr capsule Take 1 capsule by mouth daily.  amphetamine-dextroamphetamine (ADDERALL) 10 MG tablet Take 10 mg by mouth daily.     aspirin EC 81 MG tablet Take 81 mg by mouth daily. Swallow whole.     clonazePAM (KLONOPIN) 1 MG tablet Take 1 mg by mouth 2 (two) times daily.     diphenhydrAMINE (BENADRYL) 25 MG tablet Take 25 mg by mouth every 6 (six) hours as needed for itching or allergies.     diphenhydramine-acetaminophen (TYLENOL PM) 25-500 MG TABS tablet Take 1 tablet by mouth at bedtime as needed (pain/sleep).     glimepiride (AMARYL) 1 MG tablet Take 1 mg by mouth daily.     ibuprofen (ADVIL) 800 MG tablet Take 800 mg by mouth every 6 (six) hours.     metFORMIN (GLUCOPHAGE-XR) 750 MG 24 hr tablet      XIIDRA 5 % SOLN Apply 1 drop to eye 2 (two) times daily.     No current facility-administered medications for this visit.   PHYSICAL EXAMINATION: ECOG PERFORMANCE STATUS: 0 - Asymptomatic  Vitals:   03/17/21 1325  BP: 133/80  Pulse: 96  Resp: 16  Temp: (!) 97.5 F (36.4 C)  SpO2: 99%   Filed Weights   03/17/21 1325  Weight: 175 lb 12.8 oz (79.7 kg)    GENERAL:alert, no distress and comfortable SKIN: skin color, texture, turgor are normal, no rashes or significant lesions EYES: normal, conjunctiva are pink and non-injected, sclera clear OROPHARYNX:no exudate, no erythema and lips, buccal mucosa, and tongue normal  NECK: supple, thyroid normal size,  non-tender, without nodularity LYMPH:  no palpable lymphadenopathy in the cervical, axillary LUNGS: clear to auscultation and percussion with normal breathing effort HEART: regular rate & rhythm and no murmurs and no lower extremity edema ABDOMEN:abdomen soft, non-tender and normal bowel sounds Musculoskeletal:no cyanosis of digits and no clubbing  PSYCH: alert & oriented x 3 with fluent speech NEURO: no focal motor/sensory deficits  LABORATORY DATA:  I have reviewed the data as listed Lab Results  Component Value Date   WBC 13.0 (H) 04/27/2015   HGB 14.9 04/27/2015   HCT 43.3 04/27/2015   MCV 89.1 04/27/2015   PLT 261 04/27/2015     Chemistry      Component Value Date/Time   NA 137 04/27/2015 1159   K 4.0 04/27/2015 1159   CL 105 04/27/2015 1159   CO2 24 04/27/2015 1159   BUN 8 04/27/2015 1159   CREATININE 0.63 04/27/2015 1159      Component Value Date/Time   CALCIUM 9.6 04/27/2015 1159   ALKPHOS 77 04/16/2012 1514   AST 24 04/16/2012 1514   ALT 36 (H) 04/16/2012 1514   BILITOT 0.4 04/16/2012 1514     I have reviewed her labs from 2016, 2017 and last labs from early 2021.  She has had chronic leukocytosis with some lymphocytosis which has actually improved on the last labs.  Her last white blood cell count from February 2021 was 10,100 with a lymphocyte count of 4000.  In 2016 she had over 12-13,000 white blood cells with 4600 lymphocytes.  RADIOGRAPHIC STUDIES: I have personally reviewed the radiological images as listed and agreed with the findings in the report. No results found.  All questions were answered. The patient knows to call the clinic with any problems, questions or concerns. I spent 30 minutes in the care of this patient including H and P, review of records, counseling and coordination of care.     Benay Pike, MD 03/17/2021 2:43 PM

## 2021-03-17 NOTE — Telephone Encounter (Signed)
Scheduled appt per 9/2 referral. Pt is aware of appt date and time.

## 2021-03-23 ENCOUNTER — Telehealth: Payer: Self-pay | Admitting: Neurology

## 2021-03-23 DIAGNOSIS — R202 Paresthesia of skin: Secondary | ICD-10-CM

## 2021-03-23 NOTE — Telephone Encounter (Signed)
Pt said she is returning a call to [facility]

## 2021-03-23 NOTE — Progress Notes (Signed)
Called patient and left a message for a call back. Per Dr. Posey Pronto ok to offer patient an EMG.

## 2021-03-24 NOTE — Progress Notes (Signed)
Called and spoke to patient and informed her of results of her MRI. Patient would like to move forward with a EMG of right arm and right leg. Patient was advised that someone will call her from the front office to schedule her for an EMG. Patient verbalized understanding and had no further questions or concerns.

## 2021-03-28 LAB — ANTINUCLEAR ANTIBODIES, IFA: ANA Ab, IFA: NEGATIVE

## 2021-03-30 DIAGNOSIS — F419 Anxiety disorder, unspecified: Secondary | ICD-10-CM | POA: Diagnosis not present

## 2021-03-30 DIAGNOSIS — F909 Attention-deficit hyperactivity disorder, unspecified type: Secondary | ICD-10-CM | POA: Diagnosis not present

## 2021-03-30 DIAGNOSIS — Z6833 Body mass index (BMI) 33.0-33.9, adult: Secondary | ICD-10-CM | POA: Diagnosis not present

## 2021-03-30 DIAGNOSIS — F17200 Nicotine dependence, unspecified, uncomplicated: Secondary | ICD-10-CM | POA: Diagnosis not present

## 2021-03-30 DIAGNOSIS — E6609 Other obesity due to excess calories: Secondary | ICD-10-CM | POA: Diagnosis not present

## 2021-03-30 DIAGNOSIS — M5030 Other cervical disc degeneration, unspecified cervical region: Secondary | ICD-10-CM | POA: Diagnosis not present

## 2021-03-30 DIAGNOSIS — F320 Major depressive disorder, single episode, mild: Secondary | ICD-10-CM | POA: Diagnosis not present

## 2021-03-31 ENCOUNTER — Telehealth (HOSPITAL_COMMUNITY): Payer: BC Managed Care – PPO | Admitting: Hematology and Oncology

## 2021-03-31 ENCOUNTER — Other Ambulatory Visit: Payer: Self-pay | Admitting: Hematology and Oncology

## 2021-03-31 NOTE — Progress Notes (Unsigned)
  I tried calling the patient this afternoon for her video visit. Nursing staff also tried to call her but unable to reach her. We saw her initially for leukocytosis.  During my last visit, I have explained to the patient she has had some degree of leukocytosis and mild lymphocytosis for years and this has not progressed over the years.  We have recommended some additional labs during her initial visit.  I was supposed to talk to her today to review these results. ANA and ESR are unremarkable.  LDH is not elevated.  CBC showed white blood cell count of 11,900, hemoglobin of 15.6 and platelet count of 348,000.  Given chronic leukocytosis and no presence of lymphocytosis today, she may just be monitored every 6 months to annually at her primary care physician unless she would like to be monitored in the hematology clinic.  We will try to convey her this message,  we will try to give her a call and we will help her schedule follow-up in about 6 months.  Kirubel Aja

## 2021-03-31 NOTE — Progress Notes (Deleted)
[place] NOTE  Patient Care Team: Sharilyn Sites, MD as PCP - General (Family Medicine)  CHIEF COMPLAINTS/PURPOSE OF CONSULTATION:  Lymphocytosis.  ASSESSMENT & PLAN:   No orders of the defined types were placed in this encounter.  This is a very pleasant 40 year old female patient with some ongoing right-sided temperature perception changes, fatigue, easy bruising referred to hematology for evaluation of lymphocytosis.  Review of systems as mentioned below pertinent for fatigue, no other B symptoms, right side always feels cold and she bruises easily. Physical examination healthy-appearing female patient, no major findings.  I have reviewed her labs from 2016, 2017 and from early 2021, she always has had some degree of leukocytosis and mild lymphocytosis, this has not progressed over the years at least from the labs we have reviewed. I have discussed that common cause of lymphocytosis stress, pain, autoimmune diseases, smoking and rarely lymphoproliferative disorders.  Given the fluctuating lymphocyte count and improvement, lymphoproliferative disorder seems less likely.  We will repeat a CBC, CMP, ESR, LDH today.  If we notice lymphocyte count over 5000, will ask for flow cytometry.  She understands that she may have to come back for repeat lab. Mom had ovarian cancer, genetics referral sent, patient agreeable. She expressed understanding of all the recommendations.  She will return to clinic for televisit to review labs and to discuss any additional recommendations. Thank you for consulting [place] in the care of this patient.  Please do not hesitate to contact us with any additional questions or concerns.  HISTORY OF PRESENTING ILLNESS:  Jamal Collin 40 y.o. female is here because of lymphocytosis.  This is a very pleasant 40 year old female patient referred to hematology for evaluation of lymphocytosis.  We have originally received this consult back in October  2021.  She mentions to me that she has been having this right-sided change in temperature perception.  She always feels cold on the right side.  She has never noticed a skin rash on the right side or pain only on the right side.  She has had an MRI brain yesterday, ordered by her PCP.  She was also noted to have some lymphocytosis and hence referred to hematology.  She complains of fatigue but no ongoing drenching night sweats (last episode may be a year ago), loss of appetite or loss of weight.  She denies any changes in breathing except related to smoking.  She smokes a pack a day.  No change in bowel habits or urinary habits.  She has an occasional yeast infection.  No new neurological complaints.  She was worried about the elevated lymphocytes.  Rest of the review of systems pertinent for intermittent right neck swelling, cough and shortness of breath related to likely smoking, bruising, insomnia, episodes of dizziness.  MEDICAL HISTORY:  Past Medical History:  Diagnosis Date   Anxiety    Asthma    Bursitis    Bursitis    right hip   Chronic UTI (urinary tract infection)    Depression    Diabetes mellitus ([place])    diet controlled   Dysmenorrhea 03/04/2015   Irregular periods 03/04/2015   Vaginal discharge 03/04/2015    SURGICAL HISTORY: Past Surgical History:  Procedure Laterality Date   DILITATION & CURRETTAGE/HYSTROSCOPY WITH NOVASURE ABLATION N/A 04/29/2015   Procedure: DILATATION & CURETTAGE/HYSTEROSCOPY WITH NOVASURE ABLATION;  Surgeon: [name], MD;  Location: [facility];  Service: Gynecology;  Laterality: N/A;   TUBAL LIGATION  01-04-01    SOCIAL  HISTORY: Social History   Socioeconomic History   Marital status: Married    Spouse name: Not on file   Number of children: 3   Years of education: Not on file   Highest education level: Not on file  Occupational History   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   Social Determinants of Health   Financial Resource Strain: Low Risk    Difficulty of Paying Living Expenses: Not hard at all  Food Insecurity: No Food Insecurity   Worried About Charity fundraiser in the Last Year: Never true   [place] in the Last Year: Never true  Transportation Needs: No Transportation Needs   Lack of Transportation (Medical): No   Lack of Transportation (Non-Medical): No  Physical Activity: Inactive   Days of Exercise per Week: 0 days   Minutes of Exercise per Session: 0 min  Stress: Stress Concern Present   Feeling of Stress : To some extent  Social Connections: Not on file  Intimate Partner Violence: Not on file    FAMILY HISTORY: Family History  Problem Relation Age of Onset   Diabetes Mother    Ovarian cancer Mother    Colon cancer Neg Hx     ALLERGIES:  has No Known Allergies.  MEDICATIONS:  Current Outpatient Medications  Medication Sig Dispense Refill   albuterol (PROVENTIL HFA;VENTOLIN HFA) 108 (90 BASE) MCG/ACT inhaler Inhale 1-2 puffs into the lungs every 6 (six) hours as needed for wheezing or shortness of breath.      amphetamine-dextroamphetamine (ADDERALL XR) 30 MG 24 hr capsule Take 1 capsule by mouth daily.     amphetamine-dextroamphetamine (ADDERALL) 10 MG tablet Take 10 mg by mouth daily.     aspirin EC 81 MG tablet Take 81 mg by mouth daily. Swallow whole.     clonazePAM (KLONOPIN) 1 MG tablet Take 1 mg by mouth 2 (two) times daily.      diphenhydrAMINE (BENADRYL) 25 MG tablet Take 25 mg by mouth every 6 (six) hours as needed for itching or allergies.     diphenhydramine-acetaminophen (TYLENOL PM) 25-500 MG TABS tablet Take 1 tablet by mouth at bedtime as needed (pain/sleep).     glimepiride (AMARYL) 1 MG tablet Take 1 mg by mouth daily.     ibuprofen (ADVIL) 800 MG tablet Take 800 mg by mouth every 6 (six) hours.     metFORMIN (GLUCOPHAGE-XR) 750 MG 24 hr tablet      XIIDRA 5 % SOLN Apply 1 drop to eye 2 (two) times daily.     No current facility-administered medications for this visit.   PHYSICAL EXAMINATION: ECOG PERFORMANCE STATUS: 0 - Asymptomatic  There were no vitals filed for this visit.  There were no vitals filed for this visit.  GENERAL:alert, no distress and comfortable SKIN: skin color, texture, turgor are normal, no rashes or significant lesions EYES: normal, conjunctiva are pink and non-injected, sclera clear OROPHARYNX:no exudate, no erythema and lips, buccal mucosa, and tongue normal  NECK: supple, thyroid normal size, non-tender, without nodularity LYMPH:  no palpable lymphadenopathy in the cervical, axillary LUNGS: clear to auscultation and percussion with normal breathing effort HEART: regular rate & rhythm and no murmurs and no lower extremity edema ABDOMEN:abdomen soft, non-tender and normal bowel sounds Musculoskeletal:no cyanosis of digits and no clubbing  PSYCH: alert & oriented x 3 with fluent speech NEURO: no focal motor/sensory deficits  LABORATORY DATA:  I have reviewed the data as listed Lab Results  Component Value Date   WBC 11.9 (H) 03/17/2021   HGB 15.6 (H) 03/17/2021   HCT 45.3 03/17/2021   MCV 88.8 03/17/2021   PLT 348 03/17/2021     Chemistry      Component Value Date/Time   NA 130 (L) 03/17/2021 1434   K 3.7 03/17/2021 1434   CL 105 03/17/2021 1434   CO2 19 (L) 03/17/2021 1434   BUN 8 03/17/2021 1434   CREATININE 0.52 03/17/2021 1434      Component Value  Date/Time   CALCIUM 8.4 (L) 03/17/2021 1434   ALKPHOS 73 03/17/2021 1434   AST 19 03/17/2021 1434   ALT 23 03/17/2021 1434   BILITOT 0.2 (L) 03/17/2021 1434     I have reviewed her labs from 2016, 2017 and last labs from early 2021.  She has had chronic leukocytosis with some lymphocytosis which has actually improved on the last labs.  Her last white blood cell count from February 2021 was 10,100 with a lymphocyte count of 4000.  In 2016 she had over 12-13,000 white blood cells with 4600 lymphocytes.  RADIOGRAPHIC STUDIES: I have personally reviewed the radiological images as listed and agreed with the findings in the report. MR BRAIN W WO CONTRAST  Result Date: 03/20/2021 CLINICAL DATA:  40 year old female with sudden onset hearing loss. Right side body numbness and weakness. EXAM: MRI HEAD WITHOUT AND WITH CONTRAST TECHNIQUE: Multiplanar, multiecho pulse sequences of the brain and surrounding structures were obtained without and with intravenous contrast. CONTRAST:  60m MULTIHANCE GADOBENATE DIMEGLUMINE 529 MG/ML IV SOLN COMPARISON:  None. FINDINGS: Brain: No restricted diffusion to suggest acute infarction. No midline shift, mass effect, ventriculomegaly, extra-axial collection or acute intracranial hemorrhage. Cervicomedullary junction and pituitary are within normal limits. Dedicated thin slice internal auditory imaging is described below. Normal cerebral volume. GPearline Cablesand white matter signal is within normal limits throughout the brain. No encephalomalacia identified. No chronic cerebral blood products. No abnormal enhancement identified. No dural thickening. Vascular: Major intracranial vascular flow voids are preserved. Skull and upper cervical spine: Smooth contour abnormality of the left clivus (series 2, image 11) appears to be cystic without enhancement (series 5, image 6) and related to the suspected prepontine cistern lesion described below. Normal background bone marrow signal. Visible  cervical spine suggests some disc degeneration at C4-C5 with possible mild spinal stenosis on series 2, image 11. Sinuses/Orbits: Negative orbits. Paranasal sinuses are well aerated. Other: Dedicated internal auditory imaging. Subtle T2 heterogeneous soft tissue occupying some of the prepontine cistern and abutting the patent basilar artery (series 11, image 19) is associated with signal heterogeneity on both DWI and FLAIR, but is nonenhancing, and subtle 2 non apparent on T1 and T2 weighted images. Small circumscribed defect of the dorsal left clivus again noted. 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 Superimposed cerebellopontine angles remain normal. Normal bilateral cisternal and intracanalicular 7th and 8th cranial nerve segments. Symmetric T2 signal in the bilateral cochlea and vestibular structures. Bilateral mastoid air cells are clear. Stylomastoid foramina appear normal. Parotid glands appear normal. No other skull base abnormality identified. IMPRESSION: 1. 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 2. Otherwise normal MRI appearance of the brain. 3. Suspected mild cervical spine degeneration and spinal stenosis at C4-C5. Electronically Signed   By: Genevie Ann M.D.   On: 03/20/2021 07:42    All questions were answered. The patient knows to call the clinic with any problems, questions or concerns. I spent 30 minutes in the care of this patient including H and P, review of records, counseling and coordination of care.     Benay Pike, MD 03/31/2021 12:08 PM

## 2021-04-10 ENCOUNTER — Inpatient Hospital Stay: Payer: BC Managed Care – PPO | Attending: Genetic Counselor | Admitting: Genetic Counselor

## 2021-04-10 ENCOUNTER — Other Ambulatory Visit: Payer: Self-pay | Admitting: Genetic Counselor

## 2021-04-10 ENCOUNTER — Inpatient Hospital Stay: Payer: BC Managed Care – PPO

## 2021-04-10 DIAGNOSIS — Z8041 Family history of malignant neoplasm of ovary: Secondary | ICD-10-CM

## 2021-04-10 NOTE — Addendum Note (Signed)
Addended by: Ignacia Bayley on: 04/10/2021 11:26 AM   Modules accepted: Orders

## 2021-04-19 ENCOUNTER — Encounter: Payer: BC Managed Care – PPO | Admitting: Neurology

## 2021-04-19 ENCOUNTER — Encounter: Payer: Self-pay | Admitting: Neurology

## 2021-04-19 DIAGNOSIS — Z029 Encounter for administrative examinations, unspecified: Secondary | ICD-10-CM

## 2021-08-02 DIAGNOSIS — F909 Attention-deficit hyperactivity disorder, unspecified type: Secondary | ICD-10-CM | POA: Diagnosis not present

## 2021-08-02 DIAGNOSIS — Z6834 Body mass index (BMI) 34.0-34.9, adult: Secondary | ICD-10-CM | POA: Diagnosis not present

## 2021-08-02 DIAGNOSIS — E1165 Type 2 diabetes mellitus with hyperglycemia: Secondary | ICD-10-CM | POA: Diagnosis not present

## 2021-08-02 DIAGNOSIS — F419 Anxiety disorder, unspecified: Secondary | ICD-10-CM | POA: Diagnosis not present

## 2021-08-02 DIAGNOSIS — E6609 Other obesity due to excess calories: Secondary | ICD-10-CM | POA: Diagnosis not present

## 2021-08-02 DIAGNOSIS — F320 Major depressive disorder, single episode, mild: Secondary | ICD-10-CM | POA: Diagnosis not present

## 2021-08-11 ENCOUNTER — Other Ambulatory Visit (HOSPITAL_COMMUNITY): Payer: Self-pay | Admitting: Family Medicine

## 2021-08-11 ENCOUNTER — Other Ambulatory Visit (HOSPITAL_COMMUNITY): Payer: Self-pay | Admitting: Internal Medicine

## 2021-08-11 DIAGNOSIS — Z1231 Encounter for screening mammogram for malignant neoplasm of breast: Secondary | ICD-10-CM

## 2021-08-15 ENCOUNTER — Encounter (HOSPITAL_COMMUNITY): Payer: Self-pay | Admitting: *Deleted

## 2021-08-15 DIAGNOSIS — Z6834 Body mass index (BMI) 34.0-34.9, adult: Secondary | ICD-10-CM | POA: Diagnosis not present

## 2021-08-15 DIAGNOSIS — E1165 Type 2 diabetes mellitus with hyperglycemia: Secondary | ICD-10-CM | POA: Diagnosis not present

## 2021-08-15 DIAGNOSIS — F419 Anxiety disorder, unspecified: Secondary | ICD-10-CM | POA: Diagnosis not present

## 2021-08-15 DIAGNOSIS — E6609 Other obesity due to excess calories: Secondary | ICD-10-CM | POA: Diagnosis not present

## 2021-08-15 DIAGNOSIS — F320 Major depressive disorder, single episode, mild: Secondary | ICD-10-CM | POA: Diagnosis not present

## 2021-08-15 DIAGNOSIS — F909 Attention-deficit hyperactivity disorder, unspecified type: Secondary | ICD-10-CM | POA: Diagnosis not present

## 2021-08-23 ENCOUNTER — Encounter (HOSPITAL_COMMUNITY): Payer: Self-pay

## 2021-08-23 ENCOUNTER — Ambulatory Visit (HOSPITAL_COMMUNITY): Payer: BC Managed Care – PPO

## 2021-09-11 ENCOUNTER — Ambulatory Visit (HOSPITAL_COMMUNITY): Payer: BC Managed Care – PPO

## 2021-09-13 DIAGNOSIS — F320 Major depressive disorder, single episode, mild: Secondary | ICD-10-CM | POA: Diagnosis not present

## 2021-09-13 DIAGNOSIS — G629 Polyneuropathy, unspecified: Secondary | ICD-10-CM | POA: Diagnosis not present

## 2021-09-13 DIAGNOSIS — F419 Anxiety disorder, unspecified: Secondary | ICD-10-CM | POA: Diagnosis not present

## 2021-09-13 DIAGNOSIS — Z6834 Body mass index (BMI) 34.0-34.9, adult: Secondary | ICD-10-CM | POA: Diagnosis not present

## 2021-09-13 DIAGNOSIS — F909 Attention-deficit hyperactivity disorder, unspecified type: Secondary | ICD-10-CM | POA: Diagnosis not present

## 2021-09-13 DIAGNOSIS — E1165 Type 2 diabetes mellitus with hyperglycemia: Secondary | ICD-10-CM | POA: Diagnosis not present

## 2021-09-13 DIAGNOSIS — E6609 Other obesity due to excess calories: Secondary | ICD-10-CM | POA: Diagnosis not present

## 2021-09-13 LAB — HEMOGLOBIN A1C: Hemoglobin A1C: 9

## 2021-09-21 ENCOUNTER — Other Ambulatory Visit: Payer: Self-pay

## 2021-09-21 ENCOUNTER — Encounter: Payer: Self-pay | Admitting: Adult Health

## 2021-09-21 ENCOUNTER — Other Ambulatory Visit (HOSPITAL_COMMUNITY)
Admission: RE | Admit: 2021-09-21 | Discharge: 2021-09-21 | Disposition: A | Discharge status: Home / Self Care | Payer: BC Managed Care – PPO | Source: Ambulatory Visit | Attending: Adult Health | Admitting: Adult Health

## 2021-09-21 ENCOUNTER — Ambulatory Visit (INDEPENDENT_AMBULATORY_CARE_PROVIDER_SITE_OTHER): Payer: BC Managed Care – PPO | Admitting: Adult Health

## 2021-09-21 VITALS — BP 121/85 | HR 106 | Ht 63.0 in | Wt 185.0 lb

## 2021-09-21 DIAGNOSIS — Z01419 Encounter for gynecological examination (general) (routine) without abnormal findings: Secondary | ICD-10-CM

## 2021-09-21 DIAGNOSIS — N949 Unspecified condition associated with female genital organs and menstrual cycle: Secondary | ICD-10-CM

## 2021-09-21 DIAGNOSIS — Z1211 Encounter for screening for malignant neoplasm of colon: Secondary | ICD-10-CM | POA: Diagnosis not present

## 2021-09-21 DIAGNOSIS — N852 Hypertrophy of uterus: Secondary | ICD-10-CM

## 2021-09-21 LAB — HEMOCCULT GUIAC POC 1CARD (OFFICE): Fecal Occult Blood, POC: NEGATIVE

## 2021-09-21 NOTE — Progress Notes (Signed)
Patient ID: Olivia Church, female   DOB: 01-Apr-1981, 41 y.o.   MRN: [id number] ?History of Present Illness: ?Olivia Church is a 41 year old white female, married, [id number], in for a well woman gyn exam and pap. She has no sex drive, never had one. She is seeing a neurologist had numbness right side, and sees Dr Dorris Fetch. Also seen at hematology. ?PCP is Dr Hilma Favors. ? ? ?Current Medications, Allergies, Past Medical History, Past Surgical History, Family History and Social History were reviewed in [employer] record.   ? ? ?Review of Systems: ?Patient denies any headaches, hearing loss, fatigue, blurred vision, shortness of breath, chest pain, abdominal pain, problems with bowel movements, urination, or intercourse. No joint pain, she has mood swings, is bipolar and is on meds. ?See HPI for positives.  ? ? ?Physical Exam:BP 121/85 (BP Location: Left Arm, Patient Position: Sitting, Cuff Size: Normal)   Pulse (!) 106   Ht '5\' 3"'$  (1.6 m)   Wt 185 lb (83.9 kg)   LMP  (LMP Unknown)   BMI 32.77 kg/m?   ?General:  Well developed, well nourished, no acute distress ?Skin:  Warm and dry ?Neck:  Midline trachea, normal thyroid, good ROM, no lymphadenopathy ?Lungs; Clear to auscultation bilaterally ?Breast:  No dominant palpable mass, retraction, or nipple discharge ?Cardiovascular: Regular rate and rhythm ?Abdomen:  Soft, non tender, no hepatosplenomegaly ?Pelvic:  External genitalia is normal in appearance, no lesions.  The vagina is normal in appearance. Urethra has no lesions or masses. The cervix is bulbous. Pap with HR HPV genotyping performed.  Uterus is felt to be slightly enlarged and tender.  No adnexal masses or tenderness noted.Bladder is non tender, no masses felt. ?Rectal: Good sphincter tone, no polyps, or hemorrhoids felt.  Hemoccult negative. ?Extremities/musculoskeletal:  No swelling or varicosities noted, no clubbing or cyanosis ?Psych:  No mood changes, alert and cooperative,seems  happy ?AA is 2 ?Fall risk is low ?Depression screen [facility] 2/9 09/21/2021 03/16/2021  ?Decreased Interest 1 0  ?Down, Depressed, Hopeless 1 0  ?PHQ - 2 Score 2 0  ?Altered sleeping 3 -  ?Tired, decreased energy 2 -  ?Change in appetite 2 -  ?Feeling bad or failure about yourself  1 -  ?Trouble concentrating 2 -  ?Moving slowly or fidgety/restless 1 -  ?Suicidal thoughts 0 -  ?PHQ-9 Score 13 -  ? She is on meds.  ?GAD 7 : Generalized Anxiety Score 09/21/2021  ?Nervous, Anxious, on Edge 1  ?Control/stop worrying 2  ?Worry too much - different things 2  ?Trouble relaxing 2  ?Restless 1  ?Easily annoyed or irritable 2  ?Afraid - awful might happen 0  ?Total GAD 7 Score 10  ? ? [place] - 09/21/21 0940   ? ?  ? Pregnancy Intention Screening  ? Does the patient want to become pregnant in the next year? N/A   ? Does the patient's partner want to become pregnant in the next year? N/A   ? Would the patient like to discuss contraceptive options today? N/A   ?  ? Contraception Wrap Up  ? Current Method Female Sterilization   ? End Method Female Sterilization   ? Contraception Counseling Provided No   ? ?  ?  ? ?  ? Examination chaperoned by Marcelino Scot RN ? ?Impression and Plan: ?1. Encounter for gynecological examination with Papanicolaou smear of cervix ?Pap sent ?Physical in 1 year ?Pap in 3 years if normal ?- Cytology - PAP ?Mammogram  soon ?Labs with specialists  ? ?2. Encounter for screening fecal occult blood testing ?- POCT occult blood stool ? ?3. Tenderness of uterus ?Will get pelvic US to assess uterus and ovaries, and talk when results back ?- US PELVIC COMPLETE WITH TRANSVAGINAL; Future ? ?4. Uterine enlargement ?Will get [place] in about 3 weeks to assess uterus and ovaries  ?- US PELVIC COMPLETE WITH TRANSVAGINAL; Future  ? ? ? ?  ?  ?

## 2021-09-25 LAB — CYTOLOGY - PAP
Adequacy: ABSENT
Comment: NEGATIVE
Diagnosis: NEGATIVE
High risk HPV: NEGATIVE

## 2021-09-27 ENCOUNTER — Ambulatory Visit (INDEPENDENT_AMBULATORY_CARE_PROVIDER_SITE_OTHER): Payer: BC Managed Care – PPO | Admitting: "Endocrinology

## 2021-09-27 ENCOUNTER — Encounter: Payer: Self-pay | Admitting: "Endocrinology

## 2021-09-27 ENCOUNTER — Other Ambulatory Visit: Payer: Self-pay

## 2021-09-27 VITALS — BP 94/68 | HR 80 | Ht 63.0 in | Wt 181.4 lb

## 2021-09-27 DIAGNOSIS — F17200 Nicotine dependence, unspecified, uncomplicated: Secondary | ICD-10-CM | POA: Diagnosis not present

## 2021-09-27 DIAGNOSIS — E1165 Type 2 diabetes mellitus with hyperglycemia: Secondary | ICD-10-CM | POA: Diagnosis not present

## 2021-09-27 MED ORDER — METFORMIN HCL ER (MOD) 1000 MG PO TB24: 1000.0000 mg | ORAL_TABLET | Freq: Every day | ORAL | 1 refills | Status: DC

## 2021-09-27 NOTE — Patient Instructions (Signed)

## 2021-09-27 NOTE — Progress Notes (Signed)
? ?                                                             Endocrinology Consult Note  ?     09/27/2021, 5:10 PM ? ? ?Subjective:  ? ? Patient ID: Olivia Church, female    DOB: August 13, 1980.  ?Olivia Church is being seen in consultation for management of currently uncontrolled symptomatic diabetes requested by  Sharilyn Sites, MD. ? ? ?Past Medical History:  ?Diagnosis Date  ? Anxiety   ? Asthma   ? Bursitis   ? Bursitis   ? right hip  ? Chronic UTI (urinary tract infection)   ? Depression   ? Diabetes mellitus ([place])   ? diet controlled  ? Dysmenorrhea 03/04/2015  ? Irregular periods 03/04/2015  ? Leukocytosis 03/2021  ? Lymphocytosis 03/2021  ? Neurological abnormality   ? congenital ecchordosisphysaliphora  ? 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 Spinal stenosis   ? Vaginal discharge 03/04/2015  ? ? ?Past Surgical History:  ?Procedure Laterality Date  ? DILITATION & CURRETTAGE/HYSTROSCOPY WITH NOVASURE ABLATION N/A 04/29/2015  ? Procedure: DILATATION & CURETTAGE/HYSTEROSCOPY WITH NOVASURE ABLATION;  Surgeon: [name], MD;  Location: [facility];  Service: Gynecology;  Laterality: N/A;  ? ROTATOR CUFF REPAIR    ? TUBAL LIGATION  01/04/2001  ? ? ?Social History  ? ?Socioeconomic History  ? Marital status: Married  ?  Spouse name: Not on file  ? Number of children: 3  ? Years of education: Not on file  ? Highest education level: Not on file  ?Occupational History  ? 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 Smoking status: Every Day  ?  Packs/day: 1.00  ?  Years: 15.00  ?  Pack years: 15.00  ?  Types: Cigarettes  ? Smokeless tobacco: Never  ? Tobacco comments:  ?  tobacco info given 04/16/12  ?Vaping Use  ? Vaping Use: Former  ?Substance and Sexual Activity  ? Alcohol use: Yes  ?  Comment: rare  ? Drug use: No  ? Sexual activity: Yes  ?  Birth control/protection: Surgical  ?  Comment: tubal  ?Other Topics Concern  ? Not on file  ?Social History Narrative  ? Living with  husband  ? Right handed  ? Drink coffee 4-5 cups a day,  2 energy drink.  ? ?Social Determinants of Health  ? ?Financial Resource Strain: Low Risk   ? Difficulty of Paying Living Expenses: Not hard at all  ?Food Insecurity: No Food Insecurity  ? Worried About Charity fundraiser in the Last Year: Never true  ? Ran Out of Food in the Last Year: Never true  ?Transportation Needs: No Transportation Needs  ? Lack of Transportation (Medical): No  ? Lack of Transportation (Non-Medical): No  ?Physical Activity: Insufficiently Active  ? Days of Exercise per Week: 1 day  ? Minutes of Exercise per Session: 20 min  ?Stress: Stress Concern Present  ? Feeling of Stress : Very much  ?Social Connections: Moderately Integrated  ? Frequency of Communication with Friends and Family: Three times a week  ? Frequency of Social Gatherings with Friends and Family: Once a week  ? Attends Religious Services: 1 to 4 times per  year  ? Active Member of Clubs or Organizations: No  ? Attends Archivist Meetings: Never  ? Marital Status: Married  ? ? ?Family History  ?Problem Relation Age of Onset  ? Diabetes Mother   ? Ovarian cancer Mother   ? Colon cancer Neg Hx   ? ? ?Outpatient Encounter Medications as of 09/27/2021  ?Medication Sig  ? albuterol (PROVENTIL HFA;VENTOLIN HFA) 108 (90 BASE) MCG/ACT inhaler Inhale 1-2 puffs into the lungs every 6 (six) hours as needed for wheezing or shortness of breath.   ? amphetamine-dextroamphetamine (ADDERALL XR) 30 MG 24 hr capsule Take 1 capsule by mouth daily.  ? amphetamine-dextroamphetamine (ADDERALL) 10 MG tablet Take 10 mg by mouth daily.  ? aspirin EC 81 MG tablet Take 81 mg by mouth daily. Swallow whole.  ? atomoxetine (STRATTERA) 80 MG capsule Take 80 mg by mouth every morning.  ? clonazePAM (KLONOPIN) 1 MG tablet Take 1 mg by mouth 2 (two) times daily as needed.  ? Continuous Blood Gluc Transmit (DEXCOM G6 TRANSMITTER) MISC USE AS DIRECTED TO CHECK BLOOD GLUCOSE (Patient not taking:  Reported on 09/21/2021)  ? diphenhydrAMINE (BENADRYL) 25 MG tablet Take 25 mg by mouth every 6 (six) hours as needed for itching or allergies.  ? diphenhydramine-acetaminophen (TYLENOL PM) 25-500 MG TABS tablet Take 1 tablet by mouth at bedtime as needed (pain/sleep).  ? fluconazole (DIFLUCAN) 150 MG tablet Take 150 mg by mouth daily. (Patient not taking: Reported on 09/21/2021)  ? ibuprofen (ADVIL) 800 MG tablet Take 800 mg by mouth every 6 (six) hours.  ? lamoTRIgine (LAMICTAL) 100 MG tablet Take 100 mg by mouth 2 (two) times daily.  ? lisinopril (ZESTRIL) 2.5 MG tablet Take 2.5 mg by mouth daily.  ? metFORMIN (GLUMETZA) 1000 MG (MOD) 24 hr tablet Take 1 tablet (1,000 mg total) by mouth daily with breakfast.  ? triazolam (HALCION) 0.25 MG tablet Take 0.25 mg by mouth daily as needed.  ? XIIDRA 5 % SOLN Apply 1 drop to eye 2 (two) times daily.  ? [DISCONTINUED] glimepiride (AMARYL) 1 MG tablet Take 1 mg by mouth daily.  ? [DISCONTINUED] metFORMIN (GLUCOPHAGE-XR) 750 MG 24 hr tablet Take 2 tablets daily  ? ?No facility-administered encounter medications on file as of 09/27/2021.  ? ? ?ALLERGIES: ?No Known Allergies ? ?VACCINATION STATUS: ? ?There is no immunization history on file for this patient. ? ?Diabetes ?She has type 2 diabetes mellitus. Onset time: Patient was diagnosed at approximate age of 2 years.  She did have a preceding episode of gestational diabetes. Her disease course has been worsening. There are no hypoglycemic associated symptoms. Pertinent negatives for hypoglycemia include no confusion, headaches, pallor or seizures. Associated symptoms include fatigue, polydipsia and polyuria. Pertinent negatives for diabetes include no polyphagia. There are no hypoglycemic complications. Symptoms are worsening. There are no diabetic complications. (Mood disorder. ?) Risk factors for coronary artery disease include diabetes mellitus, dyslipidemia, family history, obesity, sedentary lifestyle and tobacco exposure.  Current diabetic treatments: She is currently on glimepiride 2 mg p.o. daily, metformin 150 mg ER twice a day. Her weight is fluctuating minimally. She is following a generally unhealthy diet. When asked about meal planning, she reported none. She has not had a previous visit with a dietitian. She rarely participates in exercise. (She did not bring any logs nor meter with her.  She uses her Dexcom which she has not shared yet.  Her A1c was 9% recently.  Denies hypoglycemia) An ACE inhibitor/angiotensin II receptor  blocker is being taken.  ? ? ?Review of Systems  ?Constitutional:  Positive for fatigue. Negative for chills, fever and unexpected weight change.  ?HENT:  Negative for trouble swallowing and voice change.   ?Eyes:  Negative for visual disturbance.  ?Respiratory:  Negative for cough and wheezing.   ?Cardiovascular:  Negative for palpitations and leg swelling.  ?Gastrointestinal:  Negative for diarrhea, nausea and vomiting.  ?Endocrine: Positive for polydipsia and polyuria. Negative for cold intolerance, heat intolerance and polyphagia.  ?Musculoskeletal:  Negative for arthralgias.  ?Skin:  Negative for color change, pallor, rash and wound.  ?Neurological:  Negative for seizures and headaches.  ?Psychiatric/Behavioral:  Negative for confusion and suicidal ideas.   ? ?Objective:  ?  ?Vitals with BMI 09/27/2021 09/21/2021 03/17/2021  ?Height '5\' 3"'$  '5\' 3"'$  -  ?Weight 181 lbs 6 oz 185 lbs 175 lbs 13 oz  ?BMI 32.14 32.78 -  ?Systolic 94 275 170  ?Diastolic 68 85 80  ?Pulse 80 106 96  ? ? ?BP 94/68   Pulse 80   Ht '5\' 3"'$  (1.6 m)   Wt 181 lb 6.4 oz (82.3 kg)   LMP  (LMP Unknown)   BMI 32.13 kg/m?   ?Wt Readings from Last 3 Encounters:  ?09/27/21 181 lb 6.4 oz (82.3 kg)  ?09/21/21 185 lb (83.9 kg)  ?03/17/21 175 lb 12.8 oz (79.7 kg)  ?  ? ?Physical Exam ?Constitutional:   ?   Appearance: She is well-developed.  ?HENT:  ?   Head: Normocephalic and atraumatic.  ?Neck:  ?   Thyroid: No thyromegaly.  ?   Trachea: No  tracheal deviation.  ?Cardiovascular:  ?   Rate and Rhythm: Normal rate and regular rhythm.  ?Pulmonary:  ?   Effort: Pulmonary effort is normal.  ?Abdominal:  ?   Tenderness: There is no abdominal tenderness. Ther

## 2021-09-29 ENCOUNTER — Other Ambulatory Visit (HOSPITAL_COMMUNITY): Payer: Self-pay

## 2021-09-29 DIAGNOSIS — D72820 Lymphocytosis (symptomatic): Secondary | ICD-10-CM

## 2021-10-02 ENCOUNTER — Inpatient Hospital Stay (HOSPITAL_COMMUNITY): Payer: BC Managed Care – PPO

## 2021-10-02 DIAGNOSIS — E1165 Type 2 diabetes mellitus with hyperglycemia: Secondary | ICD-10-CM | POA: Diagnosis not present

## 2021-10-09 ENCOUNTER — Ambulatory Visit (HOSPITAL_COMMUNITY): Payer: BC Managed Care – PPO | Admitting: Physician Assistant

## 2021-10-13 ENCOUNTER — Other Ambulatory Visit: Payer: BC Managed Care – PPO

## 2021-10-16 ENCOUNTER — Inpatient Hospital Stay (HOSPITAL_COMMUNITY): Payer: BC Managed Care – PPO | Attending: Hematology

## 2021-10-16 DIAGNOSIS — F909 Attention-deficit hyperactivity disorder, unspecified type: Secondary | ICD-10-CM | POA: Diagnosis not present

## 2021-10-16 DIAGNOSIS — F419 Anxiety disorder, unspecified: Secondary | ICD-10-CM | POA: Diagnosis not present

## 2021-10-16 DIAGNOSIS — Z9229 Personal history of other drug therapy: Secondary | ICD-10-CM | POA: Diagnosis not present

## 2021-10-18 ENCOUNTER — Other Ambulatory Visit: Payer: BC Managed Care – PPO

## 2021-10-23 ENCOUNTER — Ambulatory Visit (HOSPITAL_COMMUNITY): Payer: BC Managed Care – PPO | Admitting: Physician Assistant

## 2021-11-29 DIAGNOSIS — F909 Attention-deficit hyperactivity disorder, unspecified type: Secondary | ICD-10-CM | POA: Diagnosis not present

## 2021-11-29 DIAGNOSIS — F419 Anxiety disorder, unspecified: Secondary | ICD-10-CM | POA: Diagnosis not present

## 2021-11-29 DIAGNOSIS — J329 Chronic sinusitis, unspecified: Secondary | ICD-10-CM | POA: Diagnosis not present

## 2021-11-29 DIAGNOSIS — Z9229 Personal history of other drug therapy: Secondary | ICD-10-CM | POA: Diagnosis not present

## 2021-12-12 ENCOUNTER — Ambulatory Visit (HOSPITAL_BASED_OUTPATIENT_CLINIC_OR_DEPARTMENT_OTHER): Admission: RE | Admit: 2021-12-12 | Payer: BC Managed Care – PPO | Source: Ambulatory Visit

## 2021-12-12 ENCOUNTER — Other Ambulatory Visit: Payer: BC Managed Care – PPO

## 2022-01-03 ENCOUNTER — Ambulatory Visit: Payer: BC Managed Care – PPO | Admitting: "Endocrinology

## 2022-01-18 ENCOUNTER — Ambulatory Visit: Payer: BC Managed Care – PPO | Admitting: Family Medicine

## 2022-01-23 ENCOUNTER — Encounter: Payer: Self-pay | Admitting: Hematology and Oncology

## 2022-02-13 ENCOUNTER — Encounter: Payer: Self-pay | Admitting: Family Medicine

## 2022-02-13 ENCOUNTER — Ambulatory Visit (INDEPENDENT_AMBULATORY_CARE_PROVIDER_SITE_OTHER): Payer: BC Managed Care – PPO | Admitting: Family Medicine

## 2022-02-13 VITALS — BP 124/86 | HR 114 | Ht 62.0 in | Wt 168.0 lb

## 2022-02-13 DIAGNOSIS — Z1159 Encounter for screening for other viral diseases: Secondary | ICD-10-CM

## 2022-02-13 DIAGNOSIS — G4709 Other insomnia: Secondary | ICD-10-CM

## 2022-02-13 DIAGNOSIS — M792 Neuralgia and neuritis, unspecified: Secondary | ICD-10-CM | POA: Diagnosis not present

## 2022-02-13 DIAGNOSIS — F5101 Primary insomnia: Secondary | ICD-10-CM

## 2022-02-13 DIAGNOSIS — Z114 Encounter for screening for human immunodeficiency virus [HIV]: Secondary | ICD-10-CM | POA: Diagnosis not present

## 2022-02-13 DIAGNOSIS — E1165 Type 2 diabetes mellitus with hyperglycemia: Secondary | ICD-10-CM

## 2022-02-13 DIAGNOSIS — E559 Vitamin D deficiency, unspecified: Secondary | ICD-10-CM | POA: Diagnosis not present

## 2022-02-13 DIAGNOSIS — G4700 Insomnia, unspecified: Secondary | ICD-10-CM | POA: Insufficient documentation

## 2022-02-13 DIAGNOSIS — R7301 Impaired fasting glucose: Secondary | ICD-10-CM

## 2022-02-13 DIAGNOSIS — G629 Polyneuropathy, unspecified: Secondary | ICD-10-CM

## 2022-02-13 DIAGNOSIS — F17200 Nicotine dependence, unspecified, uncomplicated: Secondary | ICD-10-CM

## 2022-02-13 MED ORDER — TRAZODONE HCL 50 MG PO TABS: 25.0000 mg | ORAL_TABLET | Freq: Every evening | ORAL | 3 refills | Status: DC | PRN

## 2022-02-13 MED ORDER — DULOXETINE HCL 60 MG PO CPEP: 60.0000 mg | ORAL_CAPSULE | Freq: Every day | ORAL | 3 refills | Status: DC

## 2022-02-13 MED ORDER — TIRZEPATIDE 10 MG/0.5ML ~~LOC~~ SOAJ: SUBCUTANEOUS | 0 refills | Status: DC

## 2022-02-13 MED ORDER — HYDROXYZINE PAMOATE 25 MG PO CAPS: 25.0000 mg | ORAL_CAPSULE | Freq: Three times a day (TID) | ORAL | 0 refills | Status: DC | PRN

## 2022-02-13 NOTE — Assessment & Plan Note (Signed)
Chronic Reports trouble falling and staying asleep She has taken Tylenol PM, melatonin, Seroquel, Ambien, and triazolam with minimum relief Will start a trial of vistaril

## 2022-02-13 NOTE — Assessment & Plan Note (Addendum)
Smokes about 1 pack/day  Asked about quitting: confirms that she currently smokes cigarettes Advise to quit smoking: Educated about QUITTING to reduce the risk of cancer, cardio and cerebrovascular disease. Assess willingness: Unwilling to quit at this time, but is working on cutting back. Assist with counseling and pharmacotherapy: Counseled for 5 minutes and literature provided. Arrange for follow up: follow up in 3 months and continue to offer help.

## 2022-02-13 NOTE — Progress Notes (Addendum)
New Patient Office Visit  Subjective:  Patient ID: Olivia Church, female    DOB: 1981-06-29  Age: 41 y.o. MRN: [id number]  CC:  Chief Complaint  Patient presents with   New Patient (Initial Visit)    Pt establishing care, would like to discuss neuropathy and insomnia.     HPI Olivia Church is a 41 y.o. female with past medical history of T2DM, neuropathy and insomnia  presents for establishing care. T2DM: uncontrolled. Reports taking glimepiride and metformin and was started on [facility] 02/23/22. She denies the 3ps of diabetes. Highest blood sugar:210, and lowest is 104 Neuropathy: c/o of aching pain, numbness, and tingling in her lower legs. She reports taking magnesium oxide 400 mg with relief of symptoms.  Insomnia: chronic. Reports trouble falling and staying asleep. She has taken Tylenol PM, melatonin, Seroquel, Ambien, and triazolam with minimum relief.    Past Medical History:  Diagnosis Date   Anxiety    Asthma    Bursitis    Bursitis    right hip   Chronic UTI (urinary tract infection)    Depression    Diabetes mellitus ([place])    diet controlled   Dysmenorrhea 03/04/2015   Irregular periods 03/04/2015   Leukocytosis 03/2021   Lymphocytosis 03/2021   Neurological abnormality    congenital ecchordosisphysaliphora   Other cervical disc degeneration at C4-C5 level    Spinal stenosis    Vaginal discharge 03/04/2015    Past Surgical History:  Procedure Laterality Date   DILITATION & CURRETTAGE/HYSTROSCOPY WITH NOVASURE ABLATION N/A 04/29/2015   Procedure: DILATATION & CURETTAGE/HYSTEROSCOPY WITH NOVASURE ABLATION;  Surgeon: [name], MD;  Location: [facility];  Service: Gynecology;  Laterality: N/A;   ROTATOR CUFF REPAIR     TUBAL LIGATION  01/04/2001    Family History  Problem Relation Age of Onset   Diabetes Mother    Ovarian cancer Mother    Colon cancer Neg Hx     Social History   Socioeconomic History   Marital status: Married    Spouse  name: Not on file   Number of children: 3   Years of education: Not on file   Highest education level: Not on file  Occupational History   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   Social Determinants of Health   Financial Resource Strain: Low Risk  (09/21/2021)   Overall Financial Resource Strain (CARDIA)    Difficulty of Paying Living Expenses: Not hard at all  Food Insecurity: No Food Insecurity (09/21/2021)   Hunger Vital Sign    Worried About Running Out of Food in the Last Year: Never true    Ran Out of Food in the Last Year: Never true  Transportation Needs: No Transportation Needs (09/21/2021)   PRAPARE -  Hydrologist (Medical): No    Lack of Transportation (Non-Medical): No  Physical Activity: Insufficiently Active (09/21/2021)   Exercise Vital Sign    Days of Exercise per Week: 1 day    Minutes of Exercise per Session: 20 min  Stress: Stress Concern Present (09/21/2021)   [place]    Feeling of Stress : Very much  Social Connections: Moderately Integrated (09/21/2021)   Social Connection and Isolation Panel [NHANES]    Frequency of Communication with Friends and Family: Three times a week    Frequency of Social Gatherings with Friends and Family: Once a week    Attends Religious Services: 1 to 4 times per year    Active Member of [employer] or  Organizations: No    Attends Archivist Meetings: Never    Marital Status: Married  Human resources officer Violence: Not At Risk (09/21/2021)   Humiliation, Afraid, Rape, and Kick questionnaire    Fear of Current or Ex-Partner: No    Emotionally Abused: No    Physically Abused: No    Sexually Abused: No    ROS Review of Systems  Constitutional:  Negative for chills, fatigue and fever.  HENT:  Negative for sinus pressure and sinus pain.   Eyes:  Negative for pain and redness.  Respiratory:  Negative for chest tightness and shortness of breath.   Cardiovascular:  Negative for chest pain and palpitations.  Gastrointestinal:  Negative for nausea and vomiting.  Endocrine: Negative for polydipsia, polyphagia and polyuria.  Genitourinary:  Negative for hematuria and pelvic pain.  Musculoskeletal:  Negative for neck pain and neck stiffness.  Skin:  Negative for rash and wound.  Neurological:  Positive for numbness. Negative for dizziness and headaches.  Psychiatric/Behavioral:  Negative for self-injury and suicidal ideas.     Objective:   Today's Vitals: BP 124/86   Pulse (!) 114   Ht $R'5\' 2"'ob$  (1.575 m)   Wt 168 lb (76.2 kg)   SpO2 97%   BMI 30.73 kg/m   Physical Exam HENT:     Head: Normocephalic.     Right Ear: External ear normal.     Left Ear: External ear normal.     Nose: No congestion.  Eyes:     Extraocular Movements: Extraocular movements intact.     Pupils: Pupils are equal, round, and reactive to light.  Cardiovascular:     Rate and Rhythm: Normal rate and regular rhythm.     Pulses: Normal pulses.     Heart sounds: Normal heart sounds.  Pulmonary:     Effort: Pulmonary effort is normal.     Breath sounds: Normal breath sounds.  Abdominal:     Palpations: Abdomen is soft.  Musculoskeletal:     Right lower leg: No edema.     Left lower leg: No edema.  Skin:    Findings: No lesion or rash.  Neurological:     Mental Status: She is alert and oriented to  person, place, and time.  Psychiatric:     Comments: Normal affect     Assessment & Plan:   Problem List Items Addressed This Visit       Endocrine   Poorly controlled type 2 diabetes mellitus ([place])    Uncontrolled Reports taking glimepiride and metformin and was started in [place] on 02/23/22 She denies the 3ps of diabetes. Highest blood sugar:210, and lowest is 104 Refilled Mounjaro and increased dose to $Remov'10mg'dADbRX$   for 28 days and 12.5 for another 28 days No hx of pancreatitis or thyroid cancer      Relevant Medications   glimepiride (AMARYL) 1 MG tablet   tirzepatide (MOUNJARO) 7.5 MG/0.5ML Pen   tirzepatide (MOUNJARO) 10 MG/0.5ML Pen (Start on 02/20/2022)   Other Relevant Orders   CBC with Differential/Platelet   CMP14+EGFR   TSH + free T4   Lipid panel   Hemoglobin A1C     Nervous and Auditory   Neuropathy    c/o of aching pain, numbness, and tingling in her lower legs She reports taking magnesium oxide 400 mg with relief of symptoms Cymbalta 60 mg ordered         Other   Current smoker    Smokes about 1 pack/day  Asked about quitting: confirms that she currently smokes cigarettes Advise to quit smoking: Educated about QUITTING to reduce the risk of cancer, cardio and cerebrovascular disease. Assess willingness: Unwilling to quit at this time, but is working on cutting back. Assist with counseling and pharmacotherapy: Counseled for 5 minutes and literature provided. Arrange for follow up: follow up in 3 months and continue to offer help.       Insomnia    Chronic Reports trouble falling and staying asleep She has taken Tylenol PM, melatonin, Seroquel, Ambien, and triazolam with minimum relief Will start a trial of vistaril         Relevant Medications   hydrOXYzine (VISTARIL) 25 MG capsule   Other Visit Diagnoses     Encounter for screening for HIV    -  Primary   Relevant Orders   HIV antibody (with reflex)   Need for hepatitis C screening test        Relevant Orders   Hepatitis C Antibody   Vitamin D deficiency       Relevant Orders   Vitamin D (25 hydroxy)   IFG (impaired fasting glucose)       Relevant Orders   Hemoglobin A1C   Neuropathic pain       Relevant Medications   DULoxetine (CYMBALTA) 60 MG capsule       Outpatient Encounter Medications as of 02/13/2022  Medication Sig   albuterol (PROVENTIL HFA;VENTOLIN HFA) 108 (90 BASE) MCG/ACT inhaler Inhale 1-2 puffs into the lungs every 6 (six) hours as needed for wheezing or shortness of breath.    amphetamine-dextroamphetamine (ADDERALL XR) 30 MG 24 hr capsule Take 1 capsule by mouth daily.   amphetamine-dextroamphetamine (ADDERALL) 20 MG tablet Take 20 mg by mouth daily.   clonazePAM (KLONOPIN) 1 MG tablet Take 1 mg by mouth 2 (two) times daily as needed.   Coenzyme Q10 (COQ10 PO) Take by mouth.   Continuous Blood Gluc Transmit (DEXCOM G6 TRANSMITTER) MISC    DULoxetine (CYMBALTA) 60 MG capsule Take 1 capsule (60 mg total) by mouth daily.   ergocalciferol (VITAMIN D2) 1.25 MG (50000 UT) capsule Take 50,000 Units by mouth once a week.   hydrOXYzine (VISTARIL) 25 MG capsule Take 1 capsule (25 mg total) by mouth every 8 (eight) hours as needed.   Magnesium 400 MG CAPS Take by mouth.   metFORMIN (GLUMETZA) 1000 MG (MOD) 24 hr tablet Take 1 tablet (1,000 mg total) by mouth daily with breakfast.   [START ON 02/20/2022] tirzepatide (MOUNJARO) 10 MG/0.5ML Pen Inject 10 mg into the skin once a week for 28 days, THEN 12.5 mg once a week for 28 days.   tirzepatide (MOUNJARO) 7.5 MG/0.5ML Pen Inject 7.5 mg into  the skin once a week.   XIIDRA 5 % SOLN Apply 1 drop to eye 2 (two) times daily.   [DISCONTINUED] diphenhydramine-acetaminophen (TYLENOL PM) 25-500 MG TABS tablet Take 1 tablet by mouth at bedtime as needed (pain/sleep).   [DISCONTINUED] traZODone (DESYREL) 50 MG tablet Take 0.5-1 tablets (25-50 mg total) by mouth at bedtime as needed for sleep.   [DISCONTINUED] triazolam (HALCION)  0.25 MG tablet Take 0.25 mg by mouth daily as needed.   aspirin EC 81 MG tablet Take 81 mg by mouth daily. Swallow whole.   atomoxetine (STRATTERA) 80 MG capsule Take 80 mg by mouth every morning. (Patient not taking: Reported on 02/13/2022)   fluconazole (DIFLUCAN) 150 MG tablet Take 150 mg by mouth daily. (Patient not taking: Reported on 09/21/2021)   glimepiride (AMARYL) 1 MG tablet Take 1 mg by mouth daily.   ibuprofen (ADVIL) 800 MG tablet Take 800 mg by mouth every 6 (six) hours.   lamoTRIgine (LAMICTAL) 100 MG tablet Take 100 mg by mouth 2 (two) times daily. (Patient not taking: Reported on 02/13/2022)   lisinopril (ZESTRIL) 2.5 MG tablet Take 2.5 mg by mouth daily. (Patient not taking: Reported on 02/13/2022)   [DISCONTINUED] diphenhydrAMINE (BENADRYL) 25 MG tablet Take 25 mg by mouth every 6 (six) hours as needed for itching or allergies.   No facility-administered encounter medications on file as of 02/13/2022.    Follow-up: Return in about 3 months (around 05/16/2022).   Olivia Monday, Olivia Church

## 2022-02-13 NOTE — Patient Instructions (Addendum)
I appreciate the opportunity to provide care to you today!    Follow up:  3 months  Labs: please stop by the lab during the week to get your blood drawn (CBC, CMP, TSH, Lipid profile, HgA1c, Vit D)  Screening: HIV and Hep C  Please pick you your medication at the pharmacy   Please continue to a heart-healthy diet and increase your physical activities. Try to exercise for 27mns at least three times a week.      It was a pleasure to see you and I look forward to continuing to work together on your health and well-being. Please do not hesitate to call the office if you need care or have questions about your care.   Have a wonderful day and week. With Gratitude, GAlvira MondayMSN, FNP-BC

## 2022-02-13 NOTE — Assessment & Plan Note (Signed)
c/o of aching pain, numbness, and tingling in her lower legs She reports taking magnesium oxide 400 mg with relief of symptoms Cymbalta 60 mg ordered

## 2022-02-13 NOTE — Assessment & Plan Note (Signed)
Uncontrolled Reports taking glimepiride and metformin and was started in [place] on 02/23/22 She denies the 3ps of diabetes. Highest blood sugar:210, and lowest is 104 Refilled Mounjaro and increased dose to '10mg'$  for 28 days and 12.5 for another 28 days No hx of pancreatitis or thyroid cancer

## 2022-02-23 ENCOUNTER — Telehealth: Payer: Self-pay | Admitting: Family Medicine

## 2022-02-23 NOTE — Telephone Encounter (Signed)
Pt called stating she is wanting to know if the refill can go head and be sent in for the tirzepatide [facility]) 12.5 MG/0.5ML Pen . She states people are having a hard time getting this medication? Please contact pt if there are ny questions.     [place]

## 2022-03-09 ENCOUNTER — Telehealth: Payer: Self-pay | Admitting: Family Medicine

## 2022-03-09 NOTE — Telephone Encounter (Signed)
Patient called in need referral placed for behavior health to get amphetamine-dextroamphetamine (ADDERALL XR) 30 MG 24 hr capsule  filled.    Patient also needs 12.5 dose of  tirzepatide [facility])  Pen sent in.  Patient would like a call back in regards

## 2022-03-12 ENCOUNTER — Other Ambulatory Visit: Payer: Self-pay

## 2022-03-12 ENCOUNTER — Telehealth: Payer: Self-pay | Admitting: Family Medicine

## 2022-03-12 NOTE — Telephone Encounter (Signed)
Pt called stating she is needing the refill on tirzepatide [facility]) 12.'5mg'$ Pen. She is taking the last dosage of the '10mg'$ . Can you please refill?

## 2022-03-13 ENCOUNTER — Other Ambulatory Visit: Payer: Self-pay | Admitting: Family Medicine

## 2022-03-13 DIAGNOSIS — F988 Other specified behavioral and emotional disorders with onset usually occurring in childhood and adolescence: Secondary | ICD-10-CM

## 2022-03-13 DIAGNOSIS — E1165 Type 2 diabetes mellitus with hyperglycemia: Secondary | ICD-10-CM

## 2022-03-13 MED ORDER — TIRZEPATIDE 12.5 MG/0.5ML ~~LOC~~ SOAJ: SUBCUTANEOUS | 0 refills | Status: DC

## 2022-03-13 NOTE — Telephone Encounter (Signed)
Olivia Church has been refilled and referral placed to behavioral health

## 2022-03-13 NOTE — Telephone Encounter (Signed)
Duplicate message. 

## 2022-03-13 NOTE — Telephone Encounter (Signed)
Pt informed via vm

## 2022-04-02 ENCOUNTER — Other Ambulatory Visit: Payer: Self-pay

## 2022-04-02 ENCOUNTER — Telehealth: Payer: Self-pay | Admitting: Family Medicine

## 2022-04-02 DIAGNOSIS — F419 Anxiety disorder, unspecified: Secondary | ICD-10-CM

## 2022-04-02 DIAGNOSIS — F32A Depression, unspecified: Secondary | ICD-10-CM

## 2022-04-02 NOTE — Telephone Encounter (Signed)
Please put a referral in for her depression to behavioral health in [place]

## 2022-04-02 NOTE — Telephone Encounter (Signed)
Pt called stating that she was supposed to have had a behavioral health referral. Can you please send to the [facility] in [place]?

## 2022-04-02 NOTE — Telephone Encounter (Signed)
Referral already in, called pt gave her phone number to call.

## 2022-04-06 ENCOUNTER — Telehealth: Payer: Self-pay | Admitting: Family Medicine

## 2022-04-06 ENCOUNTER — Other Ambulatory Visit: Payer: Self-pay | Admitting: Family Medicine

## 2022-04-06 ENCOUNTER — Ambulatory Visit (INDEPENDENT_AMBULATORY_CARE_PROVIDER_SITE_OTHER): Payer: BC Managed Care – PPO

## 2022-04-06 DIAGNOSIS — N949 Unspecified condition associated with female genital organs and menstrual cycle: Secondary | ICD-10-CM

## 2022-04-06 DIAGNOSIS — N852 Hypertrophy of uterus: Secondary | ICD-10-CM | POA: Diagnosis not present

## 2022-04-06 DIAGNOSIS — E1165 Type 2 diabetes mellitus with hyperglycemia: Secondary | ICD-10-CM

## 2022-04-06 MED ORDER — TIRZEPATIDE 15 MG/0.5ML ~~LOC~~ SOAJ: 15.0000 mg | SUBCUTANEOUS | 0 refills | Status: AC

## 2022-04-06 NOTE — Progress Notes (Signed)
PELVIC US TA/TV:heterogeneous anteverted uterus,right mid intramural fibroid 10.1 x 9 x 8 mm,EEC 3.1 mm,difficult to visualize endometrial boarders,mult small myometrial cysts (?adenomyosis),normal ovaries,ovaries appear mobile,no pain during ultrasound,no free fluid

## 2022-04-06 NOTE — Telephone Encounter (Signed)
Pt informed

## 2022-04-06 NOTE — Telephone Encounter (Signed)
Pt called stating she only has 1 wks left of tirzepatide (MOUNJARO) 12.5 MG/0.5ML Pen . States it is time to increase. Can you please refill?   [facility] [address]

## 2022-04-06 NOTE — Telephone Encounter (Signed)
Prescription sent

## 2022-04-10 ENCOUNTER — Other Ambulatory Visit: Payer: BC Managed Care – PPO

## 2022-04-23 ENCOUNTER — Ambulatory Visit (INDEPENDENT_AMBULATORY_CARE_PROVIDER_SITE_OTHER): Payer: BC Managed Care – PPO | Admitting: Psychiatry

## 2022-04-23 ENCOUNTER — Encounter (HOSPITAL_COMMUNITY): Payer: Self-pay | Admitting: Psychiatry

## 2022-04-23 DIAGNOSIS — F330 Major depressive disorder, recurrent, mild: Secondary | ICD-10-CM | POA: Insufficient documentation

## 2022-04-23 DIAGNOSIS — F410 Panic disorder [episodic paroxysmal anxiety] without agoraphobia: Secondary | ICD-10-CM

## 2022-04-23 DIAGNOSIS — F411 Generalized anxiety disorder: Secondary | ICD-10-CM

## 2022-04-23 DIAGNOSIS — F5101 Primary insomnia: Secondary | ICD-10-CM | POA: Diagnosis not present

## 2022-04-23 DIAGNOSIS — F4310 Post-traumatic stress disorder, unspecified: Secondary | ICD-10-CM | POA: Diagnosis not present

## 2022-04-23 DIAGNOSIS — Z79899 Other long term (current) drug therapy: Secondary | ICD-10-CM

## 2022-04-23 DIAGNOSIS — F17210 Nicotine dependence, cigarettes, uncomplicated: Secondary | ICD-10-CM

## 2022-04-23 DIAGNOSIS — F17200 Nicotine dependence, unspecified, uncomplicated: Secondary | ICD-10-CM

## 2022-04-23 MED ORDER — CLONAZEPAM 0.5 MG PO TABS: 0.5000 mg | ORAL_TABLET | Freq: Two times a day (BID) | ORAL | 0 refills | Status: DC | PRN

## 2022-04-23 MED ORDER — MIRTAZAPINE 15 MG PO TABS: 15.0000 mg | ORAL_TABLET | Freq: Every day | ORAL | 0 refills | Status: DC

## 2022-04-23 NOTE — Patient Instructions (Addendum)
For ADHD testing please get an appointment with one of the following providers: [facility] ([facility] of [facility] [phone] has multiple providers. [facility] [phone] has several providers specializing in ADHD/Bipolar= Jillene Bucks, PhD is expert at Adult ADHD, Darryl Nestle, PhD treats adults with both diagnoses.   We decreased your klonopin to 0.'5mg'$  twice daily as needed for panic. Next month, we will try to move to 0.'5mg'$  once daily as needed for panic. We also added remeron '15mg'$  nightly to your regimen which should help with your insomnia.
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
## 2022-04-25 ENCOUNTER — Encounter (HOSPITAL_COMMUNITY): Payer: Self-pay

## 2022-05-07 ENCOUNTER — Encounter (HOSPITAL_COMMUNITY): Payer: Self-pay

## 2022-05-08 ENCOUNTER — Encounter: Payer: Self-pay | Admitting: Family Medicine

## 2022-05-08 ENCOUNTER — Other Ambulatory Visit: Payer: Self-pay | Admitting: Family Medicine

## 2022-05-08 DIAGNOSIS — E1165 Type 2 diabetes mellitus with hyperglycemia: Secondary | ICD-10-CM

## 2022-05-08 MED ORDER — TIRZEPATIDE 12.5 MG/0.5ML ~~LOC~~ SOAJ: SUBCUTANEOUS | 0 refills | Status: AC

## 2022-05-08 NOTE — Telephone Encounter (Signed)
Please advise the patient o complete the current 15 mg dose, a prescription for 12.5 mg have been sent to her pharmacy to continue after her current 15 mg dose

## 2022-05-18 ENCOUNTER — Ambulatory Visit: Payer: BC Managed Care – PPO | Admitting: Family Medicine

## 2022-05-28 ENCOUNTER — Encounter: Payer: Self-pay | Admitting: Family Medicine

## 2022-05-28 ENCOUNTER — Ambulatory Visit: Payer: BC Managed Care – PPO | Admitting: Family Medicine

## 2022-06-04 ENCOUNTER — Other Ambulatory Visit (HOSPITAL_COMMUNITY): Payer: Self-pay | Admitting: Psychiatry

## 2022-06-04 DIAGNOSIS — F330 Major depressive disorder, recurrent, mild: Secondary | ICD-10-CM

## 2022-06-04 DIAGNOSIS — F410 Panic disorder [episodic paroxysmal anxiety] without agoraphobia: Secondary | ICD-10-CM

## 2022-06-04 DIAGNOSIS — F5101 Primary insomnia: Secondary | ICD-10-CM

## 2022-06-20 ENCOUNTER — Other Ambulatory Visit: Payer: Self-pay | Admitting: Family Medicine

## 2022-06-20 DIAGNOSIS — M792 Neuralgia and neuritis, unspecified: Secondary | ICD-10-CM

## 2022-06-29 DIAGNOSIS — Z0001 Encounter for general adult medical examination with abnormal findings: Secondary | ICD-10-CM | POA: Diagnosis not present

## 2022-06-29 DIAGNOSIS — Z1331 Encounter for screening for depression: Secondary | ICD-10-CM | POA: Diagnosis not present

## 2022-06-29 DIAGNOSIS — E1165 Type 2 diabetes mellitus with hyperglycemia: Secondary | ICD-10-CM | POA: Diagnosis not present

## 2022-06-29 DIAGNOSIS — M67911 Unspecified disorder of synovium and tendon, right shoulder: Secondary | ICD-10-CM | POA: Diagnosis not present

## 2022-06-29 DIAGNOSIS — F909 Attention-deficit hyperactivity disorder, unspecified type: Secondary | ICD-10-CM | POA: Diagnosis not present

## 2022-06-29 DIAGNOSIS — Z1231 Encounter for screening mammogram for malignant neoplasm of breast: Secondary | ICD-10-CM | POA: Diagnosis not present

## 2022-06-29 DIAGNOSIS — G629 Polyneuropathy, unspecified: Secondary | ICD-10-CM | POA: Diagnosis not present

## 2022-06-29 DIAGNOSIS — Z6830 Body mass index (BMI) 30.0-30.9, adult: Secondary | ICD-10-CM | POA: Diagnosis not present

## 2022-06-29 DIAGNOSIS — Z9229 Personal history of other drug therapy: Secondary | ICD-10-CM | POA: Diagnosis not present

## 2022-07-12 ENCOUNTER — Ambulatory Visit (HOSPITAL_COMMUNITY): Payer: BC Managed Care – PPO

## 2022-07-14 IMAGING — MR MR HEAD WO/W CM
11 of 12 series · 35 of 48 positions shown · IV contrast (multihance)
Comparison: None.

CLINICAL DATA: 40-year-old female with sudden onset hearing loss.
Right side body numbness and weakness.

EXAM:
MRI HEAD WITHOUT AND WITH CONTRAST
TECHNIQUE: Multiplanar, multiecho pulse sequences of the brain and surrounding
structures were obtained without and with intravenous contrast.
CONTRAST:  16mL MULTIHANCE GADOBENATE DIMEGLUMINE 529 MG/ML IV SOLN

[Series 2: T1 · sagittal · 5.0mm · 0.45mm/px · 3 of 21 slices shown (1 of 3)]
[im 1/21]
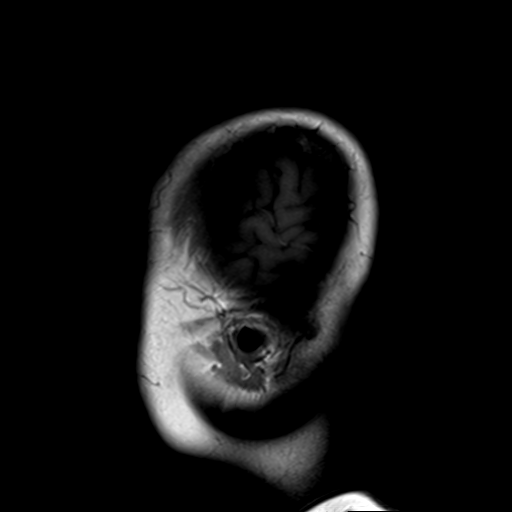
[im 11/21]
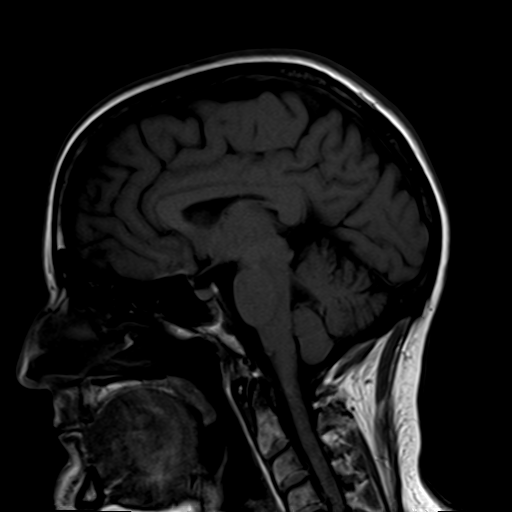
[im 21/21]
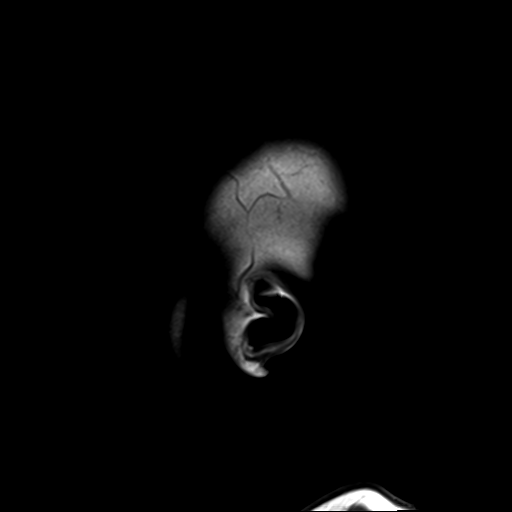

[Series 3: DWI · axial · 3.0mm · 1.80mm/px · z∈[-58,+88]mm · 9 of 100 slices shown (1 of 2)]
[im 1/100]
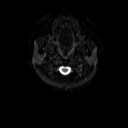
[im 19/100]
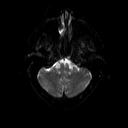
[im 28/100]
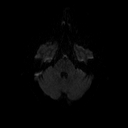
[im 46/100]
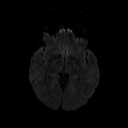
[im 55/100]
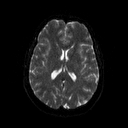
[im 73/100]
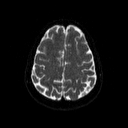
[im 82/100]
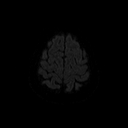
[im 91/100]
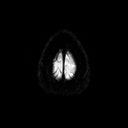
[im 100/100]
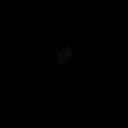

[Series 4: DWI · axial · 3.0mm · 1.80mm/px · z∈[-58,+88]mm · 5 of 44 slices shown (2 of 2)]
[im 1/44]
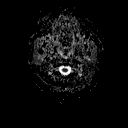
[im 11/44]
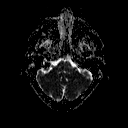
[im 22/44]
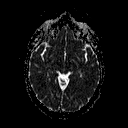
[im 33/44]
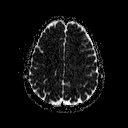
[im 44/44]
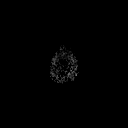

[Series 5: T2 · axial · 5.0mm · 0.45mm/px · z∈[-59,+83]mm · 3 of 23 slices shown]
[im 1/23]
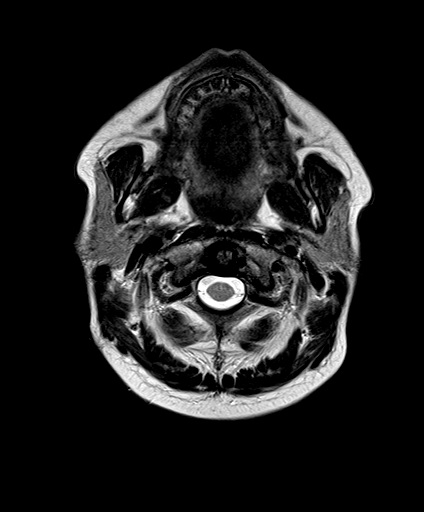
[im 12/23]
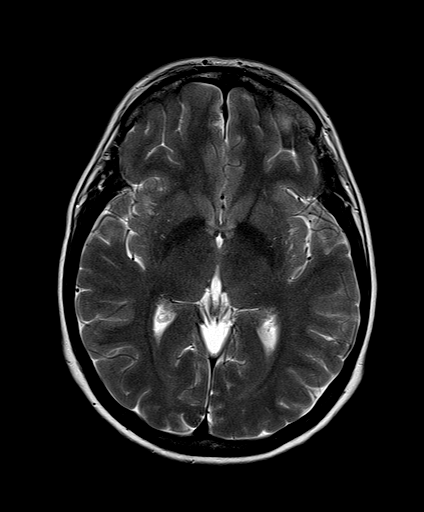
[im 23/23]
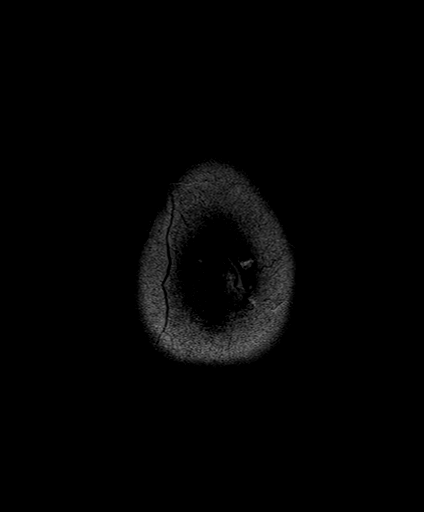

[Series 6: FLAIR · axial · 3.0mm · 0.45mm/px · z∈[-44,+112]mm · 3 of 27 slices shown]
[im 1/27]
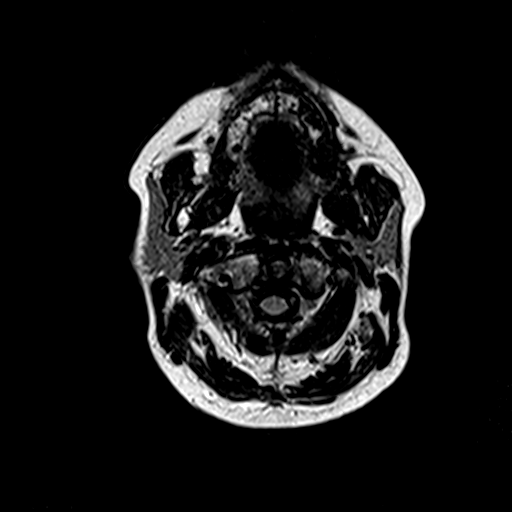
[im 14/27]
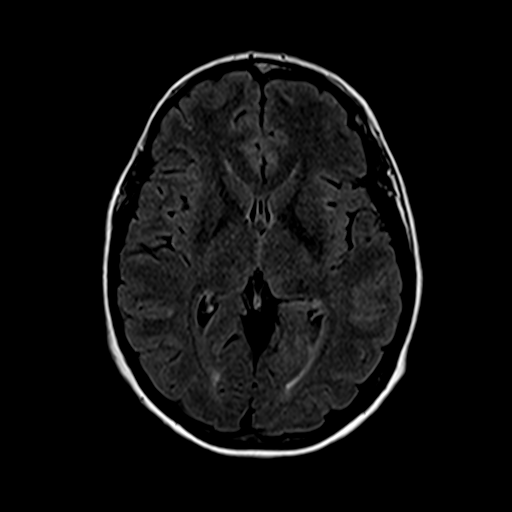
[im 27/27]
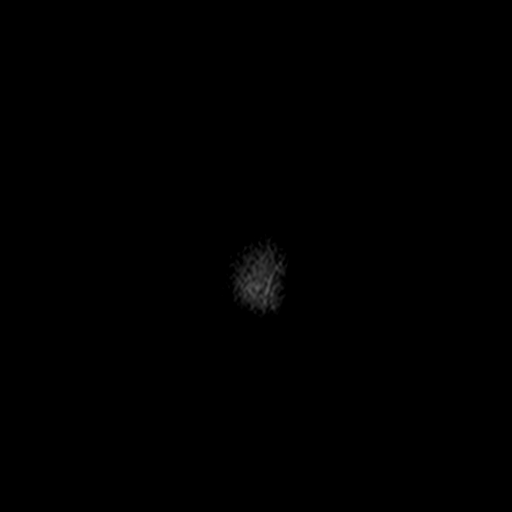

[Series 8: swi_images · axial · 3.0mm · 0.90mm/px · z∈[-45,+96]mm · 6 of 48 slices shown]
[im 1/48]
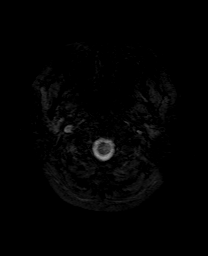
[im 10/48]
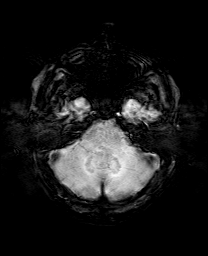
[im 19/48]
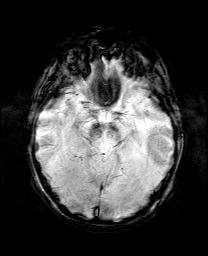
[im 29/48]
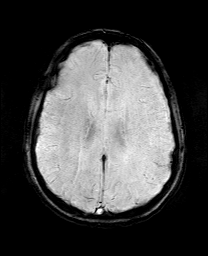
[im 38/48]
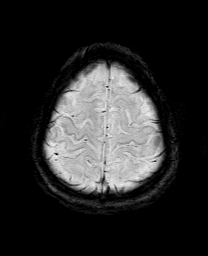
[im 48/48]
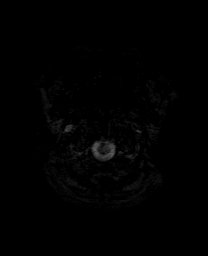

[Series 9: T1 · coronal · 3.0mm · 0.35mm/px · 1 of 11 slices shown (2 of 3)]
[im 1/11]
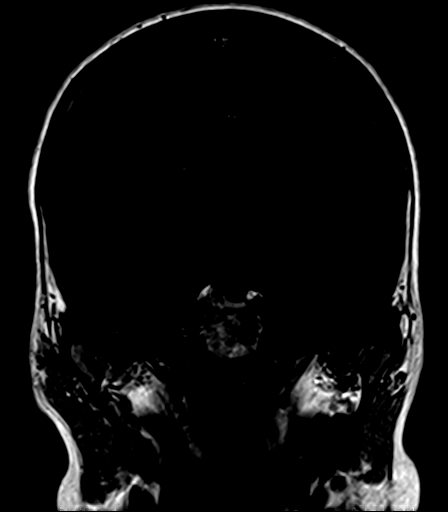

[Series 10: T1 · axial · 3.0mm · 0.35mm/px · 1 of 11 slices shown (3 of 3)]
[im 1/11]
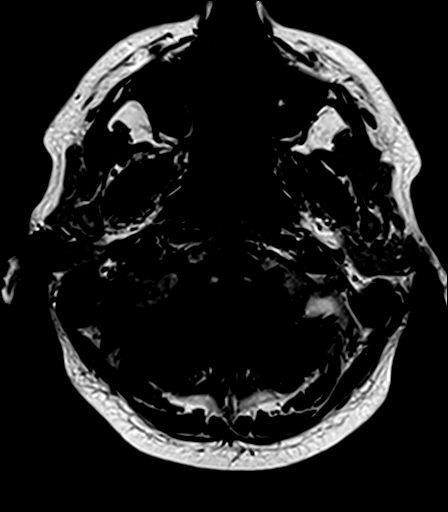

[Series 11: bSSFP · axial · 1.0mm · 0.28mm/px · z∈[-29,-18]mm · 2 of 36 slices shown]
[im 1/36]
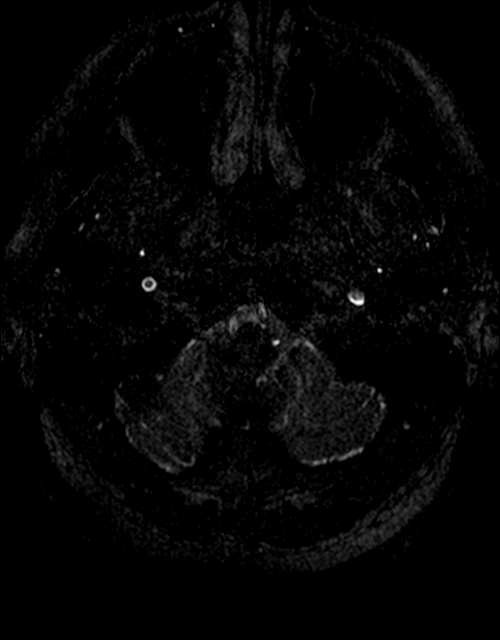
[im 12/36]
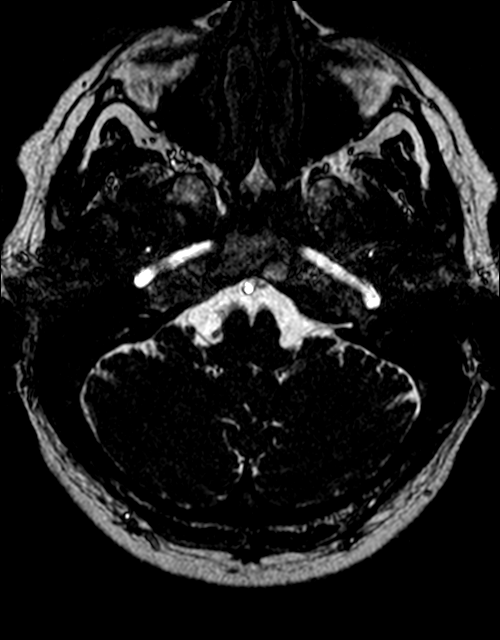

[Series 12: T1 post-contrast · coronal · 3.0mm · 0.35mm/px · 1 of 11 slices shown (1 of 2)]
[im 1/11]
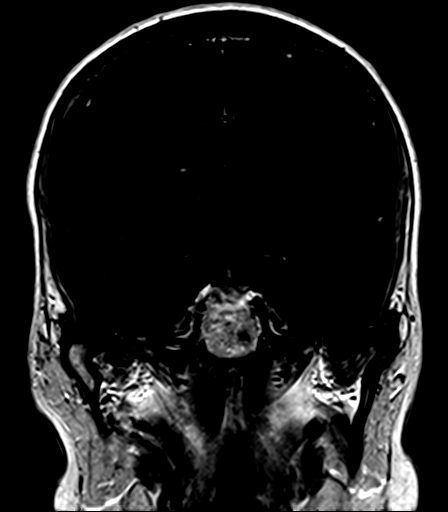

[Series 13: T1 post-contrast · axial · 3.0mm · 0.35mm/px · 1 of 11 slices shown (2 of 2)]
[im 1/11]
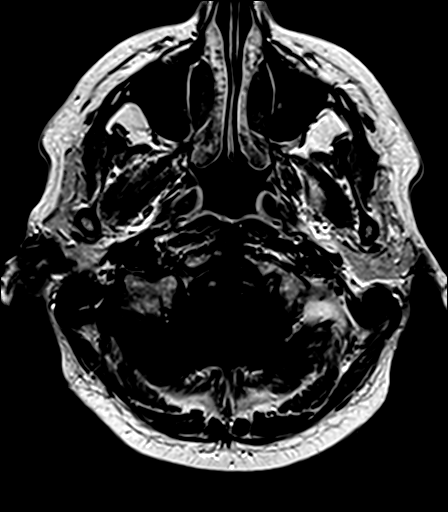

[35 of 48 positions shown; findings below may reference images not displayed]

FINDINGS: Brain: No restricted diffusion to suggest acute infarction. No
midline shift, mass effect, ventriculomegaly, extra-axial collection
or acute intracranial hemorrhage. Cervicomedullary junction and
pituitary are within normal limits.

Dedicated thin slice internal auditory imaging is described below.

Normal cerebral volume. Gray and white matter signal is within
normal limits throughout the brain. No encephalomalacia identified.
No chronic cerebral blood products. No abnormal enhancement
identified. No dural thickening.

Vascular: Major intracranial vascular flow voids are preserved.

Skull and upper cervical spine: Smooth contour abnormality of the
left clivus (series 2, image 11) appears to be cystic without
enhancement (series 5, image 6) and related to the suspected
prepontine cistern lesion described below. Normal background bone
marrow signal. Visible cervical spine suggests some disc
degeneration at C4-C5 with possible mild spinal stenosis on series
2, image 11.

Sinuses/Orbits: Negative orbits. Paranasal sinuses are well aerated.

Other: Dedicated internal auditory imaging.

Subtle T2 heterogeneous soft tissue occupying some of the prepontine
cistern and abutting the patent basilar artery (series 11, image 19)
is associated with signal heterogeneity on both DWI and FLAIR, but
is nonenhancing, and subtle 2 non apparent on T1 and T2 weighted
images. Small circumscribed defect of the dorsal left clivus again
noted. This constellation is most compatible with Ecchordosis
physaliphora, a congenital benign hamartomatous lesion derived from
notochord remnants which is generally asymptomatic.

Superimposed cerebellopontine angles remain normal. Normal bilateral
cisternal and intracanalicular 7th and 8th cranial nerve segments.
Symmetric T2 signal in the bilateral cochlea and vestibular
structures. Bilateral mastoid air cells are clear. Stylomastoid
foramina appear normal. Parotid glands appear normal. No other skull
base abnormality identified.
IMPRESSION: 1. Normal internal auditory imaging aside from evidence of
Ecchordosis Physaliphora, a congenital benign hamartomatous lesion
derived from notochord remnants which is generally asymptomatic.
2. Otherwise normal MRI appearance of the brain.
3. Suspected mild cervical spine degeneration and spinal stenosis at
C4-C5.

## 2022-08-23 DIAGNOSIS — E113293 Type 2 diabetes mellitus with mild nonproliferative diabetic retinopathy without macular edema, bilateral: Secondary | ICD-10-CM | POA: Diagnosis not present

## 2022-09-11 DIAGNOSIS — M542 Cervicalgia: Secondary | ICD-10-CM | POA: Diagnosis not present

## 2022-09-11 DIAGNOSIS — M7501 Adhesive capsulitis of right shoulder: Secondary | ICD-10-CM | POA: Diagnosis not present

## 2022-09-11 DIAGNOSIS — M25511 Pain in right shoulder: Secondary | ICD-10-CM | POA: Diagnosis not present

## 2022-09-11 DIAGNOSIS — M75101 Unspecified rotator cuff tear or rupture of right shoulder, not specified as traumatic: Secondary | ICD-10-CM | POA: Diagnosis not present

## 2022-09-27 DIAGNOSIS — E6609 Other obesity due to excess calories: Secondary | ICD-10-CM | POA: Diagnosis not present

## 2022-09-27 DIAGNOSIS — Z6831 Body mass index (BMI) 31.0-31.9, adult: Secondary | ICD-10-CM | POA: Diagnosis not present

## 2022-09-27 DIAGNOSIS — E1165 Type 2 diabetes mellitus with hyperglycemia: Secondary | ICD-10-CM | POA: Diagnosis not present

## 2022-09-27 DIAGNOSIS — F909 Attention-deficit hyperactivity disorder, unspecified type: Secondary | ICD-10-CM | POA: Diagnosis not present

## 2022-09-27 DIAGNOSIS — G4700 Insomnia, unspecified: Secondary | ICD-10-CM | POA: Diagnosis not present

## 2022-12-04 ENCOUNTER — Encounter: Payer: Self-pay | Admitting: Adult Health

## 2022-12-04 ENCOUNTER — Ambulatory Visit (INDEPENDENT_AMBULATORY_CARE_PROVIDER_SITE_OTHER): Payer: BC Managed Care – PPO | Admitting: Adult Health

## 2022-12-04 VITALS — BP 108/80 | HR 91 | Ht 62.0 in | Wt 170.0 lb

## 2022-12-04 DIAGNOSIS — N764 Abscess of vulva: Secondary | ICD-10-CM

## 2022-12-04 DIAGNOSIS — L02224 Furuncle of groin: Secondary | ICD-10-CM | POA: Insufficient documentation

## 2022-12-04 MED ORDER — SILVER SULFADIAZINE 1 % EX CREA: 1.0000 | TOPICAL_CREAM | Freq: Two times a day (BID) | CUTANEOUS | 0 refills | Status: DC

## 2022-12-04 MED ORDER — DOXYCYCLINE HYCLATE 100 MG PO TABS: 100.0000 mg | ORAL_TABLET | Freq: Two times a day (BID) | ORAL | 0 refills | Status: DC

## 2022-12-04 NOTE — Progress Notes (Signed)
  Subjective:     Patient ID: Olivia Church, female   DOB: 03-08-1981, 42 y.o.   MRN: [id number]  HPI Olivia Church is a 42 year old white female,married, [id number], in complaining of boil that has popped and has open area right labial crease, when pulled cloth off.     Component Value Date/Time   DIAGPAP  09/21/2021 1019    - Negative for intraepithelial lesion or malignancy (NILM)   HPVHIGH Negative 09/21/2021 1019   ADEQPAP  09/21/2021 1019    Satisfactory for evaluation; transformation zone component ABSENT.   PCP is Gilmore Laroche, NP.  Review of Systems Has boil that popped Reviewed past medical,surgical, social and family history. Reviewed medications and allergies.     Objective:   Physical Exam BP 108/80 (BP Location: Right Arm, Patient Position: Sitting, Cuff Size: Normal)   Pulse 91   Ht 5\' 2"  (1.575 m)   Wt 170 lb (77.1 kg)   BMI 31.09 kg/m  Skin warm and dry.Pelvic: external genitalia is normal in appearance, except has 1 cm round open area, right inner labia, is pink in center, Co exam with Dr Despina Hidden. Fall risk is low  [facility] - 12/04/22 1633       Pregnancy Intention Screening   Does the patient want to become pregnant in the next year? No    Does the patient's partner want to become pregnant in the next year? No    Would the patient like to discuss contraceptive options today? No      Contraception Wrap Up   Current Method Female Sterilization    End Method Female Sterilization    Contraception Counseling Provided No               Examination chaperoned by Malachy Mood LPN  Assessment:     1. Boil of vulva Will allow area to heal inside out Will rx doxycycline and sivadene Meds ordered this encounter  Medications   doxycycline (VIBRA-TABS) 100 MG tablet    Sig: Take 1 tablet (100 mg total) by mouth 2 (two) times daily.    Dispense:  20 tablet    Refill:  0    Order Specific Question:   Supervising Provider    Answer:   Duane Lope H [[id number]]    silver sulfADIAZINE (SILVADENE) 1 % cream    Sig: Apply 1 Application topically 2 (two) times daily.    Dispense:  50 g    Refill:  0    Order Specific Question:   Supervising Provider    Answer:   Lazaro Arms [[id number]]       Plan:     Follow up in 1 week with me

## 2022-12-11 ENCOUNTER — Ambulatory Visit: Payer: BC Managed Care – PPO | Admitting: Adult Health

## 2022-12-18 DIAGNOSIS — M25511 Pain in right shoulder: Secondary | ICD-10-CM | POA: Diagnosis not present

## 2022-12-18 DIAGNOSIS — M542 Cervicalgia: Secondary | ICD-10-CM | POA: Diagnosis not present

## 2023-01-08 DIAGNOSIS — M25511 Pain in right shoulder: Secondary | ICD-10-CM | POA: Diagnosis not present

## 2023-01-08 DIAGNOSIS — M7501 Adhesive capsulitis of right shoulder: Secondary | ICD-10-CM | POA: Diagnosis not present

## 2023-01-14 ENCOUNTER — Other Ambulatory Visit: Payer: Self-pay | Admitting: Adult Health

## 2023-01-14 DIAGNOSIS — R635 Abnormal weight gain: Secondary | ICD-10-CM

## 2023-01-14 DIAGNOSIS — R4589 Other symptoms and signs involving emotional state: Secondary | ICD-10-CM

## 2023-01-14 DIAGNOSIS — R5383 Other fatigue: Secondary | ICD-10-CM

## 2023-01-14 NOTE — Progress Notes (Signed)
Check CBC,CMP,TSH,free T4 and FSH/LH

## 2023-01-24 DIAGNOSIS — R4589 Other symptoms and signs involving emotional state: Secondary | ICD-10-CM | POA: Diagnosis not present

## 2023-01-24 DIAGNOSIS — R5383 Other fatigue: Secondary | ICD-10-CM | POA: Diagnosis not present

## 2023-01-24 DIAGNOSIS — R635 Abnormal weight gain: Secondary | ICD-10-CM | POA: Diagnosis not present

## 2023-01-25 LAB — COMPREHENSIVE METABOLIC PANEL
ALT: 30 IU/L (ref 0–32)
AST: 20 IU/L (ref 0–40)
Albumin: 4.8 g/dL (ref 3.9–4.9)
Alkaline Phosphatase: 99 IU/L (ref 44–121)
BUN/Creatinine Ratio: 13 (ref 9–23)
BUN: 9 mg/dL (ref 6–24)
Bilirubin Total: 0.4 mg/dL (ref 0.0–1.2)
CO2: 22 mmol/L (ref 20–29)
Calcium: 9.6 mg/dL (ref 8.7–10.2)
Chloride: 99 mmol/L (ref 96–106)
Creatinine, Ser: 0.69 mg/dL (ref 0.57–1.00)
Globulin, Total: 2.1 g/dL (ref 1.5–4.5)
Glucose: 120 mg/dL — ABNORMAL HIGH (ref 70–99)
Potassium: 4.4 mmol/L (ref 3.5–5.2)
Sodium: 136 mmol/L (ref 134–144)
Total Protein: 6.9 g/dL (ref 6.0–8.5)
eGFR: 110 mL/min/{1.73_m2} (ref >59)

## 2023-01-25 LAB — CBC
Hematocrit: 41.6 % (ref 34.0–46.6)
Hemoglobin: 14.8 g/dL (ref 11.1–15.9)
MCH: 30 pg (ref 26.6–33.0)
MCHC: 35.6 g/dL (ref 31.5–35.7)
MCV: 84 fL (ref 79–97)
Platelets: 352 10*3/uL (ref 150–450)
RBC: 4.94 x10E6/uL (ref 3.77–5.28)
RDW: 13 % (ref 11.7–15.4)
WBC: 9.7 10*3/uL (ref 3.4–10.8)

## 2023-01-25 LAB — FSH/LH
FSH: 45.8 m[IU]/mL
LH: 45.2 m[IU]/mL

## 2023-01-25 LAB — TSH+FREE T4
Free T4: 1.2 ng/dL (ref 0.82–1.77)
TSH: 2.02 u[IU]/mL (ref 0.450–4.500)

## 2023-01-29 ENCOUNTER — Ambulatory Visit (INDEPENDENT_AMBULATORY_CARE_PROVIDER_SITE_OTHER): Payer: BC Managed Care – PPO | Admitting: Adult Health

## 2023-01-29 ENCOUNTER — Encounter: Payer: Self-pay | Admitting: Adult Health

## 2023-01-29 VITALS — BP 128/82 | HR 92 | Ht 62.0 in | Wt 174.0 lb

## 2023-01-29 DIAGNOSIS — R61 Generalized hyperhidrosis: Secondary | ICD-10-CM

## 2023-01-29 DIAGNOSIS — R5383 Other fatigue: Secondary | ICD-10-CM | POA: Diagnosis not present

## 2023-01-29 DIAGNOSIS — R635 Abnormal weight gain: Secondary | ICD-10-CM | POA: Diagnosis not present

## 2023-01-29 DIAGNOSIS — R4189 Other symptoms and signs involving cognitive functions and awareness: Secondary | ICD-10-CM

## 2023-01-29 DIAGNOSIS — R4589 Other symptoms and signs involving emotional state: Secondary | ICD-10-CM | POA: Diagnosis not present

## 2023-01-29 DIAGNOSIS — Z780 Asymptomatic menopausal state: Secondary | ICD-10-CM | POA: Diagnosis not present

## 2023-01-29 DIAGNOSIS — R232 Flushing: Secondary | ICD-10-CM

## 2023-01-29 MED ORDER — DUAVEE 0.45-20 MG PO TABS: 1.0000 | ORAL_TABLET | Freq: Every day | ORAL | Status: DC

## 2023-01-29 NOTE — Progress Notes (Signed)
Subjective:     Patient ID: Olivia Church, female   DOB: Oct 09, 1980, 42 y.o.   MRN: [id number]  HPI Olivia Church is a 42 year old white female, married, G4P0013, in complaining of lack of energy, fatigue, mood swings, brain fog, hot flashes and night sweats, does not have period, due to ablation. FSH was 45.8 and LH was 45.2 on 01/24/23.     Component Value Date/Time   DIAGPAP  09/21/2021 1019    - Negative for intraepithelial lesion or malignancy (NILM)   HPVHIGH Negative 09/21/2021 1019   ADEQPAP  09/21/2021 1019    Satisfactory for evaluation; transformation zone component ABSENT.   PCP is Dr Sherwood Gambler.  Review of Systems +lack of energy, fatigue, mood swings, brain fog, hot flashes and night sweats, does not have period, due to ablation +weight gain  Reviewed past medical,surgical, social and family history. Reviewed medications and allergies.      Objective:   Physical Exam BP 128/82 (BP Location: Left Arm, Patient Position: Sitting, Cuff Size: Normal)   Pulse 92   Ht 5\' 2"  (1.575 m)   Wt 174 lb (78.9 kg)   BMI 31.83 kg/m  Skin warm and dry.  Lungs: clear to ausculation bilaterally. Cardiovascular: regular rate and rhythm.    Fall risk is low    01/29/2023    3:52 PM 02/13/2022    4:10 PM 09/21/2021    9:40 AM  Depression screen PHQ 2/9  Decreased Interest 2 0 1  Down, Depressed, Hopeless 2 0 1  PHQ - 2 Score 4 0 2  Altered sleeping 3  3  Tired, decreased energy 3  2  Change in appetite 2  2  Feeling bad or failure about yourself  1  1  Trouble concentrating 3  2  Moving slowly or fidgety/restless 1  1  Suicidal thoughts 0  0  PHQ-9 Score 17  13       01/29/2023    3:53 PM 09/21/2021    9:41 AM  GAD 7 : Generalized Anxiety Score  Nervous, Anxious, on Edge 1 1  Control/stop worrying 1 2  Worry too much - different things 1 2  Trouble relaxing 2 2  Restless 1 1  Easily annoyed or irritable 2 2  Afraid - awful might happen 0 0  Total GAD 7 Score 8 10       Upstream - 01/29/23 1552       Pregnancy Intention Screening   Does the patient want to become pregnant in the next year? No    Does the patient's partner want to become pregnant in the next year? No    Would the patient like to discuss contraceptive options today? No      Contraception Wrap Up   Current Method Female Sterilization    End Method Female Sterilization    Contraception Counseling Provided No             Assessment:     1. Other fatigue +fatigue  Blood count was good  2. Moody  3. Weight gain  4. Lack of energy Will try Duavee  5. Brain fog  6. Hot flashes Will try Duavee  7. Night sweats Will try Duavee  8. Menopause Denies MI, Stroke, DVT, she does not smoke Will try Duavee, # 21 tablets given take 1 daily Discussed with Dr Charlotta Newton      Plan:    Discussed signs if needs to stop meds  Follow up in 3 weeks  for ROS

## 2023-02-05 DIAGNOSIS — F909 Attention-deficit hyperactivity disorder, unspecified type: Secondary | ICD-10-CM | POA: Diagnosis not present

## 2023-02-05 DIAGNOSIS — F320 Major depressive disorder, single episode, mild: Secondary | ICD-10-CM | POA: Diagnosis not present

## 2023-02-05 DIAGNOSIS — G4700 Insomnia, unspecified: Secondary | ICD-10-CM | POA: Diagnosis not present

## 2023-02-05 DIAGNOSIS — E1165 Type 2 diabetes mellitus with hyperglycemia: Secondary | ICD-10-CM | POA: Diagnosis not present

## 2023-02-06 ENCOUNTER — Ambulatory Visit: Payer: BC Managed Care – PPO | Admitting: Adult Health

## 2023-02-19 ENCOUNTER — Other Ambulatory Visit: Payer: Self-pay | Admitting: Adult Health

## 2023-02-19 ENCOUNTER — Encounter: Payer: Self-pay | Admitting: Adult Health

## 2023-02-19 ENCOUNTER — Ambulatory Visit (INDEPENDENT_AMBULATORY_CARE_PROVIDER_SITE_OTHER): Payer: BC Managed Care – PPO | Admitting: Adult Health

## 2023-02-19 VITALS — BP 120/85 | HR 102 | Ht 62.0 in | Wt 173.5 lb

## 2023-02-19 DIAGNOSIS — R232 Flushing: Secondary | ICD-10-CM

## 2023-02-19 DIAGNOSIS — R5383 Other fatigue: Secondary | ICD-10-CM

## 2023-02-19 DIAGNOSIS — R4589 Other symptoms and signs involving emotional state: Secondary | ICD-10-CM

## 2023-02-19 DIAGNOSIS — R61 Generalized hyperhidrosis: Secondary | ICD-10-CM | POA: Diagnosis not present

## 2023-02-19 DIAGNOSIS — R4189 Other symptoms and signs involving cognitive functions and awareness: Secondary | ICD-10-CM

## 2023-02-19 DIAGNOSIS — E1165 Type 2 diabetes mellitus with hyperglycemia: Secondary | ICD-10-CM | POA: Diagnosis not present

## 2023-02-19 MED ORDER — ESTRADIOL 1 MG PO TABS: 1.0000 mg | ORAL_TABLET | Freq: Every day | ORAL | 3 refills | Status: DC

## 2023-02-19 MED ORDER — PROGESTERONE 200 MG PO CAPS: 200.0000 mg | ORAL_CAPSULE | Freq: Every day | ORAL | 3 refills | Status: DC

## 2023-02-19 NOTE — Progress Notes (Signed)
  Subjective:     Patient ID: Olivia Church, female   DOB: [birth year], 42 y.o.   MRN: [id number]  HPI Olivia Church is a 42 year old white female, married, [id number], back in follow up on taking [facility], took last one today, she says may have helped moods some but not the fatigue, lack of energy, hot flashes or night sweats,or brain fog. She says she wants to sleep a lot.    Component Value Date/Time   DIAGPAP  09/21/2021 1019    - Negative for intraepithelial lesion or malignancy (NILM)   HPVHIGH Negative 09/21/2021 1019   ADEQPAP  09/21/2021 1019    Satisfactory for evaluation; transformation zone component ABSENT.    PCP is Gilmore Laroche, NP   Review of Systems +fatigue +lack of energy +hot flashes +night sweats  +Brain fog Reviewed past medical,surgical, social and family history. Reviewed medications and allergies.     Objective:   Physical Exam BP 120/85 (BP Location: Left Arm, Patient Position: Sitting, Cuff Size: Normal)   Pulse (!) 102   Ht 5\' 2"  (1.575 m)   Wt 173 lb 8 oz (78.7 kg)   BMI 31.73 kg/m     Skin warm and dry. Lungs: clear to ausculation bilaterally. Cardiovascular: regular rate and rhythm.   Upstream - 02/19/23 1517       Pregnancy Intention Screening   Does the patient want to become pregnant in the next year? No    Does the patient's partner want to become pregnant in the next year? No    Would the patient like to discuss contraceptive options today? No      Contraception Wrap Up   Current Method Female Sterilization    End Method Female Sterilization    Contraception Counseling Provided No             Assessment:     1. Other fatigue +fatigue, wants to sleep  Will try estrace 1 mg and Prometrium 200 mg daily   Meds ordered this encounter  Medications   estradiol (ESTRACE) 1 MG tablet    Sig: Take 1 tablet (1 mg total) by mouth daily.    Dispense:  30 tablet    Refill:  3    Order Specific Question:   Supervising Provider     Answer:   Duane Lope H [[id number]]   progesterone (PROMETRIUM) 200 MG capsule    Sig: Take 1 capsule (200 mg total) by mouth daily.    Dispense:  30 capsule    Refill:  3    Order Specific Question:   Supervising Provider    Answer:   Despina Hidden, LUTHER H [[id number]]    2. Lack of energy No energy  3. Moody Moods may be better  4. Night sweats +night sweats  5. Brain fog +brain fog  6. Hot flashes +hot flashes      Plan:     Follow up in 4 weeks for ROS

## 2023-03-13 ENCOUNTER — Other Ambulatory Visit: Payer: Self-pay | Admitting: Adult Health

## 2023-03-19 ENCOUNTER — Ambulatory Visit: Payer: BC Managed Care – PPO | Admitting: Adult Health

## 2023-05-17 ENCOUNTER — Other Ambulatory Visit: Payer: Self-pay | Admitting: Adult Health

## 2023-07-02 DIAGNOSIS — E1165 Type 2 diabetes mellitus with hyperglycemia: Secondary | ICD-10-CM | POA: Diagnosis not present

## 2023-07-02 DIAGNOSIS — F909 Attention-deficit hyperactivity disorder, unspecified type: Secondary | ICD-10-CM | POA: Diagnosis not present

## 2023-07-05 ENCOUNTER — Other Ambulatory Visit: Payer: Self-pay | Admitting: Adult Health

## 2023-07-12 ENCOUNTER — Other Ambulatory Visit (HOSPITAL_COMMUNITY): Payer: Self-pay | Admitting: Psychiatry

## 2023-07-12 DIAGNOSIS — F410 Panic disorder [episodic paroxysmal anxiety] without agoraphobia: Secondary | ICD-10-CM

## 2023-07-12 DIAGNOSIS — Z79899 Other long term (current) drug therapy: Secondary | ICD-10-CM

## 2023-08-12 DIAGNOSIS — Z0000 Encounter for general adult medical examination without abnormal findings: Secondary | ICD-10-CM | POA: Diagnosis not present

## 2023-09-24 DIAGNOSIS — E6609 Other obesity due to excess calories: Secondary | ICD-10-CM | POA: Diagnosis not present

## 2023-09-24 DIAGNOSIS — G4700 Insomnia, unspecified: Secondary | ICD-10-CM | POA: Diagnosis not present

## 2023-09-24 DIAGNOSIS — Z20828 Contact with and (suspected) exposure to other viral communicable diseases: Secondary | ICD-10-CM | POA: Diagnosis not present

## 2023-09-24 DIAGNOSIS — Z6837 Body mass index (BMI) 37.0-37.9, adult: Secondary | ICD-10-CM | POA: Diagnosis not present

## 2023-09-24 DIAGNOSIS — J329 Chronic sinusitis, unspecified: Secondary | ICD-10-CM | POA: Diagnosis not present

## 2023-09-24 DIAGNOSIS — R6889 Other general symptoms and signs: Secondary | ICD-10-CM | POA: Diagnosis not present

## 2023-09-24 DIAGNOSIS — F320 Major depressive disorder, single episode, mild: Secondary | ICD-10-CM | POA: Diagnosis not present

## 2023-09-24 DIAGNOSIS — E1165 Type 2 diabetes mellitus with hyperglycemia: Secondary | ICD-10-CM | POA: Diagnosis not present

## 2023-10-29 DIAGNOSIS — F909 Attention-deficit hyperactivity disorder, unspecified type: Secondary | ICD-10-CM | POA: Diagnosis not present

## 2023-10-29 DIAGNOSIS — E1165 Type 2 diabetes mellitus with hyperglycemia: Secondary | ICD-10-CM | POA: Diagnosis not present

## 2023-10-29 DIAGNOSIS — F320 Major depressive disorder, single episode, mild: Secondary | ICD-10-CM | POA: Diagnosis not present

## 2023-10-29 DIAGNOSIS — Z1331 Encounter for screening for depression: Secondary | ICD-10-CM | POA: Diagnosis not present

## 2023-10-29 DIAGNOSIS — D751 Secondary polycythemia: Secondary | ICD-10-CM | POA: Diagnosis not present

## 2023-10-29 DIAGNOSIS — Z6837 Body mass index (BMI) 37.0-37.9, adult: Secondary | ICD-10-CM | POA: Diagnosis not present

## 2023-10-29 DIAGNOSIS — Z23 Encounter for immunization: Secondary | ICD-10-CM | POA: Diagnosis not present

## 2023-10-29 DIAGNOSIS — E6609 Other obesity due to excess calories: Secondary | ICD-10-CM | POA: Diagnosis not present

## 2023-10-29 DIAGNOSIS — Z0001 Encounter for general adult medical examination with abnormal findings: Secondary | ICD-10-CM | POA: Diagnosis not present

## 2023-10-29 DIAGNOSIS — G4700 Insomnia, unspecified: Secondary | ICD-10-CM | POA: Diagnosis not present

## 2023-10-29 DIAGNOSIS — Z9229 Personal history of other drug therapy: Secondary | ICD-10-CM | POA: Diagnosis not present

## 2023-10-29 DIAGNOSIS — G629 Polyneuropathy, unspecified: Secondary | ICD-10-CM | POA: Diagnosis not present

## 2023-11-11 DIAGNOSIS — Z0001 Encounter for general adult medical examination with abnormal findings: Secondary | ICD-10-CM | POA: Diagnosis not present

## 2023-11-11 DIAGNOSIS — E1165 Type 2 diabetes mellitus with hyperglycemia: Secondary | ICD-10-CM | POA: Diagnosis not present

## 2023-11-15 ENCOUNTER — Other Ambulatory Visit (HOSPITAL_COMMUNITY): Payer: Self-pay | Admitting: Internal Medicine

## 2023-11-15 DIAGNOSIS — R748 Abnormal levels of other serum enzymes: Secondary | ICD-10-CM

## 2023-11-29 DIAGNOSIS — E1165 Type 2 diabetes mellitus with hyperglycemia: Secondary | ICD-10-CM | POA: Diagnosis not present

## 2023-11-29 DIAGNOSIS — Z23 Encounter for immunization: Secondary | ICD-10-CM | POA: Diagnosis not present

## 2023-12-05 ENCOUNTER — Ambulatory Visit (HOSPITAL_COMMUNITY)

## 2023-12-05 ENCOUNTER — Encounter (HOSPITAL_COMMUNITY): Payer: Self-pay

## 2024-02-13 ENCOUNTER — Ambulatory Visit: Admitting: Adult Health

## 2024-03-17 DIAGNOSIS — Z6839 Body mass index (BMI) 39.0-39.9, adult: Secondary | ICD-10-CM | POA: Diagnosis not present

## 2024-03-17 DIAGNOSIS — G629 Polyneuropathy, unspecified: Secondary | ICD-10-CM | POA: Diagnosis not present

## 2024-03-17 DIAGNOSIS — E6609 Other obesity due to excess calories: Secondary | ICD-10-CM | POA: Diagnosis not present

## 2024-03-17 DIAGNOSIS — E1165 Type 2 diabetes mellitus with hyperglycemia: Secondary | ICD-10-CM | POA: Diagnosis not present

## 2024-03-17 DIAGNOSIS — F909 Attention-deficit hyperactivity disorder, unspecified type: Secondary | ICD-10-CM | POA: Diagnosis not present

## 2024-03-17 DIAGNOSIS — M67911 Unspecified disorder of synovium and tendon, right shoulder: Secondary | ICD-10-CM | POA: Diagnosis not present

## 2024-03-17 DIAGNOSIS — F419 Anxiety disorder, unspecified: Secondary | ICD-10-CM | POA: Diagnosis not present

## 2024-07-13 DIAGNOSIS — M1612 Unilateral primary osteoarthritis, left hip: Secondary | ICD-10-CM | POA: Diagnosis not present

## 2024-07-13 DIAGNOSIS — S76012A Strain of muscle, fascia and tendon of left hip, initial encounter: Secondary | ICD-10-CM | POA: Diagnosis not present

## 2024-07-13 DIAGNOSIS — M545 Low back pain, unspecified: Secondary | ICD-10-CM | POA: Diagnosis not present

## 2024-08-19 ENCOUNTER — Telehealth (HOSPITAL_COMMUNITY): Payer: Self-pay

## 2024-08-19 ENCOUNTER — Ambulatory Visit (HOSPITAL_COMMUNITY)

## 2024-08-19 NOTE — Telephone Encounter (Signed)
 Patient was called concerning her missed eval appointment. No answer but left message with details about plan for POC and office number should they need to reschedule or have any questions.   Lang Ada, PT, DPT [facility] Office: [phone] 1:48 PM, 08/19/24

## 2024-09-01 ENCOUNTER — Ambulatory Visit: Payer: Self-pay | Admitting: Family Medicine
